# Patient Record
Sex: Female | Born: 1985 | Race: Black or African American | Hispanic: No | Marital: Single | Smoking: Never smoker
Health system: Southern US, Community
[De-identification: ages and names within clinical notes are randomized; demographics above are authoritative.]

## PROBLEM LIST (undated history)

## (undated) ENCOUNTER — Emergency Department: Discharge status: Absent without leave | Payer: Medicaid Other

## (undated) DIAGNOSIS — H16051 Mooren's corneal ulcer, right eye: Secondary | ICD-10-CM

## (undated) DIAGNOSIS — E669 Obesity, unspecified: Secondary | ICD-10-CM

## (undated) HISTORY — DX: Mooren's corneal ulcer, right eye: H16.051

## (undated) HISTORY — PX: EYE SURGERY: SHX253

---

## 2021-08-05 NOTE — Progress Notes (Addendum)
Client presents for Refugee Communicable Disease Screening.  The  consent for treatment was explained to the client.  The plan for the visit was explained to the client.  The communicable disease assessment was explained to the client.   Consent signed by client.     What is your Country of Origin? [place]  Been in [place] 21 years. Country of Nationality and Place of Birth: [place]   When was your date of arrival to [place]?    07/23/2021       Did you spend time anywhere else in the [place] prior to coming to [place]?  Just the airports   Name of [place]: [place]  Name of Case Worker: Kimberlee Nearing  Alien Number: [phone] Status: Refugee  Language Spoke: Swahili/French  Interpreter Badge Number [phone] and Name: Inda Merlin   Did you bring with you a copy of any Medical Records, Immunization Record.  (IOM) Yes   Do you have any Medical Conditions or Concerns today?   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  She also had surgery on her Left eye.   She has frequent bad headaches and she says she is forgetful.  She isn't sure if the headaches are from her vision issues, or if it is from something else.  She also said when she is angry her headaches are worse. Other Concern:  She has pain at her naval.  She has had this pain on and off ever since she had her C-Section.   She doesn't know why she has this pain. She also reported that she never used contraception but it took her many years to get pregnant with her first child, and wants to know to discuss reproductive health and/or contraception.   She also mentioned she was hospitalized a long time ago for Malaria.    Any Allergies to any Medications or Foods? No  Class A/B Condition:  None      Communicable Disease Screening Question: Do you have any rashes on your body?No      Are you currently experiencing any abdominal pain, vomiting,  or diarrhea?  No, except for the pain at my naval that I have had since the c-section.   Have you ever in your lifetime been sexually active?  Yes, doesn't not currently use contraceptives.        Women Only: When was the date of your last menstrual period?  08/08/2021      Do you currently have new or unusual vaginal discharge, burning, or itching?   No    The following laboratory screening tests offered and accepted by client today:  RPR, HIV,  Hep C,  , TB screening with QuantiFERON Gold, Hemoglobin Varicella Titer  Did the client decline any of the laboratory tests that were offered? No  Immunization records reviewed and Immunization Screening completed '@Flow'$ (Immunization screening) Varicella Screening:   Did the client report history of varicella disease? Not sure, doesn't remember    Titer offered and accepted Yes    Where any vaccines needed and/or administered?  Yes VIS provided, Hep A, Hep B, Flu and Tdap . Vaccines administered, copy of NCIR provided, Tolerated well, After vaccine care reviewed.  Reviewed dates for next vaccines.   General referral to primary care made?  Yes  Date of Apt:  [facility] will arrange apt. With [facility].  Seen at [facility] on  08/21/2021  Erie Radu Shelda Pal, RN

## 2021-08-08 ENCOUNTER — Ambulatory Visit (LOCAL_COMMUNITY_HEALTH_CENTER): Payer: Medicaid Other

## 2021-08-08 DIAGNOSIS — Z3200 Encounter for pregnancy test, result unknown: Secondary | ICD-10-CM

## 2021-08-08 DIAGNOSIS — Z3202 Encounter for pregnancy test, result negative: Secondary | ICD-10-CM | POA: Diagnosis not present

## 2021-08-08 DIAGNOSIS — Z719 Counseling, unspecified: Secondary | ICD-10-CM

## 2021-08-08 DIAGNOSIS — Z111 Encounter for screening for respiratory tuberculosis: Secondary | ICD-10-CM

## 2021-08-08 DIAGNOSIS — Z130 Encounter for screening for diseases of the blood and blood-forming organs and certain disorders involving the immune mechanism: Secondary | ICD-10-CM

## 2021-08-08 DIAGNOSIS — Z23 Encounter for immunization: Secondary | ICD-10-CM | POA: Diagnosis not present

## 2021-08-08 DIAGNOSIS — Z0184 Encounter for antibody response examination: Secondary | ICD-10-CM

## 2021-08-08 DIAGNOSIS — Z113 Encounter for screening for infections with a predominantly sexual mode of transmission: Secondary | ICD-10-CM

## 2021-08-08 LAB — PREGNANCY, URINE: Preg Test, Ur: NEGATIVE

## 2021-08-08 LAB — HM HEPATITIS C SCREENING LAB: HM Hepatitis Screen: NEGATIVE

## 2021-08-08 LAB — HM HIV SCREENING LAB: HM HIV Screening: NEGATIVE

## 2021-08-13 LAB — QUANTIFERON-TB GOLD PLUS
QuantiFERON Mitogen Value: >10 IU/mL
QuantiFERON Nil Value: 0.08 IU/mL
QuantiFERON TB1 Ag Value: 0.12 IU/mL
QuantiFERON TB2 Ag Value: 0.09 IU/mL
QuantiFERON-TB Gold Plus: NEGATIVE

## 2021-08-13 LAB — VARICELLA ZOSTER ANTIBODY, IGG: Varicella zoster IgG: 1089 index (ref >165)

## 2021-08-20 DIAGNOSIS — H16051 Mooren's corneal ulcer, right eye: Secondary | ICD-10-CM | POA: Insufficient documentation

## 2021-08-30 ENCOUNTER — Encounter: Payer: Self-pay | Admitting: Obstetrics and Gynecology

## 2021-09-21 ENCOUNTER — Emergency Department: Payer: Medicaid Other

## 2021-09-21 ENCOUNTER — Emergency Department
Admission: EM | Admit: 2021-09-21 | Discharge: 2021-09-21 | Disposition: A | Discharge status: Home / Self Care | Payer: Medicaid Other | Attending: Emergency Medicine | Admitting: Emergency Medicine

## 2021-09-21 ENCOUNTER — Encounter: Payer: Self-pay | Admitting: Emergency Medicine

## 2021-09-21 ENCOUNTER — Other Ambulatory Visit: Payer: Self-pay

## 2021-09-21 DIAGNOSIS — R1030 Lower abdominal pain, unspecified: Secondary | ICD-10-CM | POA: Diagnosis not present

## 2021-09-21 DIAGNOSIS — R109 Unspecified abdominal pain: Secondary | ICD-10-CM | POA: Diagnosis present

## 2021-09-21 LAB — LIPASE, BLOOD: Lipase: 41 U/L (ref 11–51)

## 2021-09-21 LAB — CBC
HCT: 40.4 % (ref 36.0–46.0)
Hemoglobin: 13 g/dL (ref 12.0–15.0)
MCH: 27.4 pg (ref 26.0–34.0)
MCHC: 32.2 g/dL (ref 30.0–36.0)
MCV: 85.1 fL (ref 80.0–100.0)
Platelets: 251 10*3/uL (ref 150–400)
RBC: 4.75 MIL/uL (ref 3.87–5.11)
RDW: 14.3 % (ref 11.5–15.5)
WBC: 5.1 10*3/uL (ref 4.0–10.5)
nRBC: 0 % (ref 0.0–0.2)

## 2021-09-21 LAB — URINALYSIS, ROUTINE W REFLEX MICROSCOPIC
Bilirubin Urine: NEGATIVE
Glucose, UA: NEGATIVE mg/dL
Hgb urine dipstick: NEGATIVE
Ketones, ur: NEGATIVE mg/dL
Leukocytes,Ua: NEGATIVE
Nitrite: NEGATIVE
Protein, ur: NEGATIVE mg/dL
Specific Gravity, Urine: 1.02 (ref 1.005–1.030)
pH: 5 (ref 5.0–8.0)

## 2021-09-21 LAB — COMPREHENSIVE METABOLIC PANEL
ALT: 15 U/L (ref 0–44)
AST: 18 U/L (ref 15–41)
Albumin: 4 g/dL (ref 3.5–5.0)
Alkaline Phosphatase: 76 U/L (ref 38–126)
Anion gap: 7 (ref 5–15)
BUN: 13 mg/dL (ref 6–20)
CO2: 23 mmol/L (ref 22–32)
Calcium: 8.9 mg/dL (ref 8.9–10.3)
Chloride: 105 mmol/L (ref 98–111)
Creatinine, Ser: 0.6 mg/dL (ref 0.44–1.00)
GFR, Estimated: >60 mL/min (ref >60)
Glucose, Bld: 112 mg/dL — ABNORMAL HIGH (ref 70–99)
Potassium: 3.8 mmol/L (ref 3.5–5.1)
Sodium: 135 mmol/L (ref 135–145)
Total Bilirubin: 0.6 mg/dL (ref 0.3–1.2)
Total Protein: 7.8 g/dL (ref 6.5–8.1)

## 2021-09-21 LAB — POC URINE PREG, ED: Preg Test, Ur: NEGATIVE

## 2021-09-21 MED ORDER — HYOSCYAMINE SULFATE SL 0.125 MG SL SUBL: 0.1250 mg | SUBLINGUAL_TABLET | Freq: Four times a day (QID) | SUBLINGUAL | 0 refills | Status: DC | PRN

## 2021-09-21 MED ORDER — OXYCODONE HCL 5 MG PO TABS
5.0000 mg | ORAL_TABLET | Freq: Once | ORAL | Status: AC
  Administered 2021-09-21: 5 mg via ORAL
  Filled 2021-09-21: qty 1

## 2021-09-21 NOTE — ED Notes (Signed)
Interpreter assisting this RN, provider CBT and registration to communicate with pt and family. Monitor attached to pt. Pt has severe back and abdominal pain that started a few days ago but got much worse today causing her to come to ER. Pt denies nausea and fever; reports chills. Pt tender at L and medial lower abdomen. Pt tender at R lower back. Pt grimaces when changing position. Pt denies chronic conditions besides her "eyes"; pt denies abdominal surgery history other than c-section. Pt denies having taken any medications today. Family remains with pt.

## 2021-09-21 NOTE — ED Triage Notes (Signed)
Pt via POV from home. Pt c/o lower back pain, lower abd pain, and nausea for the past week. Denies any urinary symptoms. Denies any vomiting. Denies any abd surgeries other than a c-section. Pt is A&Ox4 and NAD. Swahili interpreter used.

## 2021-09-21 NOTE — ED Notes (Signed)
Called [place] at this time to set up taxi transportation.

## 2021-09-21 NOTE — ED Notes (Signed)
Interpreter requested 

## 2021-09-21 NOTE — ED Notes (Signed)
Patient transported to CT 

## 2021-09-21 NOTE — ED Provider Notes (Signed)
[facility] Provider Note    Event Date/Time   First MD Initiated Contact with Patient 09/21/21 1147     (approximate)   History   Abdominal Pain   HPI  Lori Richards is a 36 y.o. female with no significant past medica and as listed in EMR presents to the emergency department for treatment and evaluation of abdominal pain for the past week. Symptoms worsened earlier today. She denies urinary symptoms, fever, nausea. Only surgical history is c-section.       Physical Exam   Triage Vital Signs: ED Triage Vitals  Enc Vitals Group     BP 09/21/21 1011 128/87     Pulse Rate 09/21/21 1011 78     Resp 09/21/21 1011 18     Temp 09/21/21 1011 97.7 F (36.5 C)     Temp Source 09/21/21 1011 Oral     SpO2 09/21/21 1011 100 %     Weight 09/21/21 1007 167 lb 8.8 oz (76 kg)     Height 09/21/21 1007 4' 11.06" (1.5 m)     Head Circumference --      Peak Flow --      Pain Score 09/21/21 1007 7     Pain Loc --      Pain Edu? --      Excl. in [place]? --     Most recent vital signs: Vitals:   09/21/21 1430 09/21/21 1453  BP:  118/79  Pulse: 70 79  Resp:  16  Temp:  97.8 F (36.6 C)  SpO2:  100%    General: Awake, no distress.  CV:  Good peripheral perfusion.  Resp:  Normal effort.  Abd:  No distention. No focal tenderness. No rebound tenderness. Other:  No rash on exposed skin   ED Results / Procedures / Treatments   Labs (all labs ordered are listed, but only abnormal results are displayed) Labs Reviewed  COMPREHENSIVE METABOLIC PANEL - Abnormal; Notable for the following components:      Result Value   Glucose, Bld 112 (*)    All other components within normal limits  LIPASE, BLOOD  CBC  URINALYSIS, ROUTINE W REFLEX MICROSCOPIC  POC URINE PREG, ED     EKG  Not indicated.   RADIOLOGY  Image independently interpreted and radiology report reviewed by me.  CT of the abdomen and pelvis was negative for acute  concerns.  PROCEDURES:  Critical Care performed: No  Procedures   MEDICATIONS ORDERED IN ED: Medications  oxyCODONE (Oxy IR/ROXICODONE) immediate release tablet 5 mg (5 mg Oral Given 09/21/21 1213)     IMPRESSION / MDM / ASSESSMENT AND PLAN / ED COURSE   I have reviewed the triage note.  Differential diagnosis includes, but is not limited to: Acute cystitis, cholecystitis, gastroenteritis, acute viral syndrome, ovarian cysts, ileus, small bowel obstruction,  Labs are all reassuring.  No leukocytosis, no electrolyte abnormalities, lipase is normal, urinalysis is normal, and pregnancy test is negative.  CT of the abdomen and pelvis with contrast is negative for any acute concerns.   Patient will be discharged home.  She will be given a prescription for Levsin which should help with the nausea and abdominal pain.  She is to follow-up with primary care if symptoms are not improving over the next couple of days.  If symptoms change or worsen, she was encouraged to return to the emergency department.  Swahili interpreter utilized for assessment, exam, and discharge instructions.  FINAL CLINICAL IMPRESSION(S) / ED DIAGNOSES   Final diagnoses:  Lower abdominal pain     Rx / DC Orders   ED Discharge Orders          Ordered    Hyoscyamine Sulfate SL (LEVSIN/SL) 0.125 MG SUBL  Every 6 hours PRN        09/21/21 1424             Note:  This document was prepared using Dragon voice recognition software and may include unintentional dictation errors.   Victorino Dike, FNP 09/21/21 1759    Harvest Dark, MD 09/21/21 1901

## 2021-09-21 NOTE — ED Notes (Signed)
Completed DC instructions with assistance from telephone interpreter (Swahili). Extensive education provided. Pt explains that she has only been in the [place] for 2 months and just started her first job here last week, so she has no [place] currency and no way to get home. RN contacted charge nurse to request input on assisting pt to get home.   Pt address is [address] [place], [place] [address]

## 2021-10-15 ENCOUNTER — Ambulatory Visit (LOCAL_COMMUNITY_HEALTH_CENTER): Payer: Medicaid Other

## 2021-10-15 DIAGNOSIS — Z23 Encounter for immunization: Secondary | ICD-10-CM | POA: Diagnosis not present

## 2021-10-15 DIAGNOSIS — Z719 Counseling, unspecified: Secondary | ICD-10-CM

## 2021-10-15 NOTE — Progress Notes (Signed)
Pt agreed for HPV and Hep B vaccines today. Administered 2 vaccines, tolerated well. Swahili interpretation provided by Louretta Parma [phone] Language line solution. Given VIS and updated NCIR. Clent Ridges, LPN.

## 2021-11-07 ENCOUNTER — Encounter: Payer: Self-pay | Admitting: Obstetrics and Gynecology

## 2021-11-07 ENCOUNTER — Ambulatory Visit: Payer: Medicaid Other

## 2021-11-12 ENCOUNTER — Ambulatory Visit: Payer: Medicaid Other

## 2021-11-26 ENCOUNTER — Ambulatory Visit (LOCAL_COMMUNITY_HEALTH_CENTER): Payer: Medicaid Other

## 2021-11-26 DIAGNOSIS — Z23 Encounter for immunization: Secondary | ICD-10-CM

## 2021-11-26 DIAGNOSIS — Z719 Counseling, unspecified: Secondary | ICD-10-CM

## 2021-11-26 NOTE — Progress Notes (Addendum)
Patient seen in nurse clinic for vaccination.  Language line used - Jeannie Done [phone].  HPV 2nd dose administered in right deltoid. Tolerated well.  VIS provided. NCIR updated and copy provided to patient.

## 2021-11-27 ENCOUNTER — Ambulatory Visit: Payer: Medicaid Other

## 2021-12-10 ENCOUNTER — Telehealth: Payer: Self-pay

## 2022-01-03 ENCOUNTER — Telehealth: Payer: Self-pay

## 2022-01-03 NOTE — Telephone Encounter (Signed)
Client reached out to [facility] because she is starting a new job, and thinks she might be pregnant.  She has an appointment with a doctor someplace but wasn't sure of the name or how to reach them.  Language line used to contact the client, [place] number [phone] [facility].      I was able to see her appointment with [facility] on 9/12.  With the assistance of the interpreter we were able to contact [facility] and have the appointment changed to Tuesday 01/28/2022.  Patient reports that she is vomiting a lot.  Advised to seek care at the emergency dept for worsening symptoms and/or unable to hold down foods or liquids    Patient also advised she could come to the [facility] for a pregnancy test if needed prior to her appointment on 10/3.     She will set up an appointment for the pregnancy test  once she has her new work schedule.     Keyra Virella Shelda Pal, RN

## 2022-01-07 ENCOUNTER — Encounter: Payer: Self-pay | Admitting: Obstetrics and Gynecology

## 2022-01-07 DIAGNOSIS — Z7689 Persons encountering health services in other specified circumstances: Secondary | ICD-10-CM

## 2022-01-09 ENCOUNTER — Ambulatory Visit (LOCAL_COMMUNITY_HEALTH_CENTER): Payer: Medicaid Other

## 2022-01-09 VITALS — BP 119/71 | Wt 167.0 lb

## 2022-01-09 DIAGNOSIS — Z3201 Encounter for pregnancy test, result positive: Secondary | ICD-10-CM | POA: Diagnosis not present

## 2022-01-09 LAB — PREGNANCY, URINE: Preg Test, Ur: POSITIVE — AB

## 2022-01-09 MED ORDER — PRENATAL VITAMINS 28-0.8 MG PO TABS: 28.0000 mg | ORAL_TABLET | Freq: Every day | ORAL | 0 refills | Status: DC

## 2022-01-09 NOTE — Progress Notes (Signed)
UPT positive.  Interpreter Geni Bers id # [id number]. See pregnancy flow sheet.  Desires care at [facility].  Complains of n/v almost daily.  Advised small frequent meals, avoid spicy foods, etc. Explained to her that we cannot give her anything for n/v until established in our prenatal clinic.  Discussed s/s ectopic pregnancy and seek medical attention if occurs. Pt agreed. The patient was dispensed prenatal vitamins  today. I provided counseling today regarding the medication. We discussed the medication, the side effects and when to call clinic. Patient given the opportunity to ask questions. Questions answered.     Walked pt to clerk for presumptive eligibility.    Tonny Branch, RN

## 2022-01-20 ENCOUNTER — Emergency Department
Admission: EM | Admit: 2022-01-20 | Discharge: 2022-01-20 | Disposition: A | Discharge status: Home / Self Care | Payer: Medicaid Other | Attending: Emergency Medicine | Admitting: Emergency Medicine

## 2022-01-20 ENCOUNTER — Other Ambulatory Visit: Payer: Self-pay

## 2022-01-20 ENCOUNTER — Emergency Department: Payer: Medicaid Other

## 2022-01-20 DIAGNOSIS — R1032 Left lower quadrant pain: Secondary | ICD-10-CM | POA: Diagnosis not present

## 2022-01-20 DIAGNOSIS — Z3A01 Less than 8 weeks gestation of pregnancy: Secondary | ICD-10-CM | POA: Insufficient documentation

## 2022-01-20 DIAGNOSIS — O26891 Other specified pregnancy related conditions, first trimester: Secondary | ICD-10-CM

## 2022-01-20 LAB — COMPREHENSIVE METABOLIC PANEL
ALT: 13 U/L (ref 0–44)
AST: 15 U/L (ref 15–41)
Albumin: 3.9 g/dL (ref 3.5–5.0)
Alkaline Phosphatase: 61 U/L (ref 38–126)
Anion gap: 7 (ref 5–15)
BUN: 7 mg/dL (ref 6–20)
CO2: 23 mmol/L (ref 22–32)
Calcium: 8.8 mg/dL — ABNORMAL LOW (ref 8.9–10.3)
Chloride: 107 mmol/L (ref 98–111)
Creatinine, Ser: 0.63 mg/dL (ref 0.44–1.00)
GFR, Estimated: >60 mL/min (ref >60)
Glucose, Bld: 106 mg/dL — ABNORMAL HIGH (ref 70–99)
Potassium: 3.5 mmol/L (ref 3.5–5.1)
Sodium: 137 mmol/L (ref 135–145)
Total Bilirubin: 0.6 mg/dL (ref 0.3–1.2)
Total Protein: 7.3 g/dL (ref 6.5–8.1)

## 2022-01-20 LAB — CBC WITH DIFFERENTIAL/PLATELET
Abs Immature Granulocytes: 0.01 10*3/uL (ref 0.00–0.07)
Basophils Absolute: 0.1 10*3/uL (ref 0.0–0.1)
Basophils Relative: 1 %
Eosinophils Absolute: 0.1 10*3/uL (ref 0.0–0.5)
Eosinophils Relative: 3 %
HCT: 34.4 % — ABNORMAL LOW (ref 36.0–46.0)
Hemoglobin: 11.1 g/dL — ABNORMAL LOW (ref 12.0–15.0)
Immature Granulocytes: 0 %
Lymphocytes Relative: 40 %
Lymphs Abs: 2 10*3/uL (ref 0.7–4.0)
MCH: 26.3 pg (ref 26.0–34.0)
MCHC: 32.3 g/dL (ref 30.0–36.0)
MCV: 81.5 fL (ref 80.0–100.0)
Monocytes Absolute: 0.4 10*3/uL (ref 0.1–1.0)
Monocytes Relative: 7 %
Neutro Abs: 2.4 10*3/uL (ref 1.7–7.7)
Neutrophils Relative %: 49 %
Platelets: 260 10*3/uL (ref 150–400)
RBC: 4.22 MIL/uL (ref 3.87–5.11)
RDW: 14.8 % (ref 11.5–15.5)
WBC: 4.9 10*3/uL (ref 4.0–10.5)
nRBC: 0 % (ref 0.0–0.2)

## 2022-01-20 LAB — URINALYSIS, ROUTINE W REFLEX MICROSCOPIC
Bilirubin Urine: NEGATIVE
Glucose, UA: NEGATIVE mg/dL
Hgb urine dipstick: NEGATIVE
Ketones, ur: 5 mg/dL — AB
Leukocytes,Ua: NEGATIVE
Nitrite: NEGATIVE
Protein, ur: NEGATIVE mg/dL
Specific Gravity, Urine: 1.003 — ABNORMAL LOW (ref 1.005–1.030)
pH: 7 (ref 5.0–8.0)

## 2022-01-20 LAB — HCG, QUANTITATIVE, PREGNANCY: hCG, Beta Chain, Quant, S: 27477 m[IU]/mL — ABNORMAL HIGH (ref <5)

## 2022-01-20 MED ORDER — ACETAMINOPHEN 500 MG PO TABS
1000.0000 mg | ORAL_TABLET | Freq: Once | ORAL | Status: AC
Start: 2022-01-20 — End: 2022-01-20
  Administered 2022-01-20: 1000 mg via ORAL
  Filled 2022-01-20: qty 2

## 2022-01-20 NOTE — Discharge Instructions (Signed)
You may take Tylenol (acetaminophen) as needed for pain as this medication is perfectly safe in pregnancy.

## 2022-01-20 NOTE — ED Provider Notes (Signed)
[facility] Provider Note    Event Date/Time   First MD Initiated Contact with Patient 01/20/22 1510     (approximate)   History   Abdominal Pain   HPI  Lori Richards is a 36 y.o. female G4, P2 who presents with lower abdominal pain.  Patient's LMP was 8/17.  She is following at the health department for her pregnancy.  Over the last 3 days she has had intermittent left lower quadrant abdominal pain.  Today while she was at work she had a gush of what she says was clearish appearing fluid.  Denies any vaginal bleeding.  Denies pain with urination.  Denies any nausea or vomiting.  No diarrhea or constipation.  Endorses feeling very fatigued today no fevers or chills.     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 --      Excl. in [place]? --     Most recent vital signs: Vitals:   01/20/22 1155  BP: 127/85  Pulse: 72  Resp: 17  Temp: 97.8 F (36.6 C)  SpO2: 95%     General: Awake, no distress.  CV:  Good peripheral perfusion.  Resp:  Normal effort.  Abd:  No distention.  Neuro:             Awake, Alert, Oriented x 3  Other:  Mild tenderness to palpation left lower quadrant suprapubic region, no guarding, abdomen soft, right lower quadrant is nontender   ED Results / Procedures / Treatments  Labs (all labs ordered  are listed, but only abnormal results are displayed) Labs Reviewed  CBC WITH DIFFERENTIAL/PLATELET - Abnormal; Notable for the following components:      Result Value   Hemoglobin 11.1 (*)    HCT 34.4 (*)    All other components within normal limits  COMPREHENSIVE METABOLIC PANEL - Abnormal; Notable for the following components:   Glucose, Bld 106 (*)    Calcium 8.8 (*)    All other components within normal limits  HCG, QUANTITATIVE, PREGNANCY - Abnormal; Notable for the following components:   hCG, Beta Chain, Quant, S 27,477 (*)    All other components within normal limits  URINALYSIS, ROUTINE W REFLEX MICROSCOPIC - Abnormal; Notable for the following components:   Color, Urine STRAW (*)    APPearance CLEAR (*)    Specific Gravity, Urine 1.003 (*)    Ketones, ur 5 (*)    All other components within normal limits  POC URINE PREG, ED     EKG  RADIOLOGY Pelvic ultrasound is pending at the time of signout   PROCEDURES:  Critical Care performed: No  Procedures  The patient is on the cardiac monitor to evaluate for evidence of arrhythmia and/or significant heart rate changes.   MEDICATIONS ORDERED IN ED: Medications  acetaminophen (TYLENOL) tablet 1,000 mg (1,000 mg Oral Given 01/20/22 1416)     IMPRESSION / MDM / ASSESSMENT AND PLAN / ED COURSE  I reviewed the triage vital signs and the nursing notes.                              Patient's presentation is most consistent with acute presentation with potential threat to life or bodily function.  Differential diagnosis includes, but is not limited to, topic pregnancy, round ligament pain, constipation, diverticulitis, pyelonephritis, kidney stone  Patient is a 36 year old female G4, P2 who presents with left lower quadrant abdominal pain for the last 3 days.  She has not had an ultrasound yet this pregnancy.  Did have some clear fluid leakage today but denies vaginal bleeding.  Denies urinary symptoms.  Her vital  signs are reassuring.  Overall she looks well on exam.  She does have tenderness primarily in the left lower quadrant suprapubic region.  UA is negative CBC and CMP are reassuring.  Will obtain pelvic ultrasound to evaluate for IUP rule out ectopic or other ovarian pathology that could be causing her pain.  We will give Tylenol.  If this is negative feeling improved I think she will be appropriate for discharge with outpatient follow-up.  Patient signed out to oncoming rider pending imaging.   Clinical Course as of 01/20/22 1514  Mon Jan 20, 2022  1340 HCG, Bosie Helper(!): 01,751 [CC]    Clinical Course User Index [CC] Leisa Lenz, [place], [place]     FINAL CLINICAL IMPRESSION(S) / ED DIAGNOSES   Final diagnoses:  LLQ pain     Rx / DC Orders   ED Discharge Orders     None        Note:  This document was prepared using Dragon voice recognition software and may include unintentional dictation errors.   Rada Hay, MD 01/20/22 [phone]

## 2022-01-20 NOTE — ED Notes (Signed)
Pt verbalized understanding of discharge instructions and follow-up care instructions.   Swahili Interpreter used in discharge teaching.   Interpreter nameMayra Neer   ID #: [id number]

## 2022-01-20 NOTE — ED Triage Notes (Signed)
First Nurse Note:  Pt via EMS from home. Pt is [redacted] weeks pregnant. Pt c/o abd pain. No vaginal bleeding. Pt is A&Ox4 and NAD  VSS WNL 131 CBG  98.6 oral   Needs Swahili interpreter

## 2022-01-20 NOTE — ED Triage Notes (Signed)
Pt presents to ED with /co of ABD pain and states she is [redacted] weeks pregnant. Pt denies vaginal bleeding but does endorse some discharge. Pt states 1 miscarriage in 2016.    Swahili Interpreter used in triage Name: Donella Stade  # [phone]

## 2022-01-20 NOTE — ED Provider Notes (Signed)
-----------------------------------------   3:10 PM on 01/20/2022 -----------------------------------------  Blood pressure 127/85, pulse 72, temperature 97.8 F (36.6 C), temperature source Oral, resp. rate 17, height '4\' 11"'$  (1.499 m), weight 75.7 kg, last menstrual period 12/12/2021, SpO2 95 %.  Assuming care from Dr. Starleen Blue.  In short, Lori Richards is a 36 y.o. female with a chief complaint of Abdominal Pain .  Refer to the original H&P for additional details.  The current plan of care is to follow-up Obstetric ultrasound for abdominal pain in pregnancy.  ----------------------------------------- 6:23 PM on 01/20/2022 ----------------------------------------- Obstetrical ultrasound shows intrauterine gestational and yolk sacs, no findings concerning for ectopic pregnancy.  On reassessment, patient reports improved pain following dose of Tylenol.  She is appropriate for discharge home with OB/GYN follow-up at the health department, was counseled to return to the ED for new or worsening symptoms.  Patient agrees with plan.    Blake Divine, MD 01/20/22 [phone]

## 2022-01-28 ENCOUNTER — Encounter: Payer: Self-pay | Admitting: Obstetrics and Gynecology

## 2022-02-03 ENCOUNTER — Encounter: Payer: Self-pay | Admitting: Obstetrics and Gynecology

## 2022-02-06 ENCOUNTER — Emergency Department: Payer: Medicaid Other

## 2022-02-06 ENCOUNTER — Emergency Department
Admission: EM | Admit: 2022-02-06 | Discharge: 2022-02-06 | Disposition: A | Discharge status: Home / Self Care | Payer: Medicaid Other | Attending: Emergency Medicine | Admitting: Emergency Medicine

## 2022-02-06 ENCOUNTER — Other Ambulatory Visit: Payer: Self-pay

## 2022-02-06 DIAGNOSIS — O26891 Other specified pregnancy related conditions, first trimester: Secondary | ICD-10-CM | POA: Insufficient documentation

## 2022-02-06 DIAGNOSIS — W19XXXA Unspecified fall, initial encounter: Secondary | ICD-10-CM

## 2022-02-06 DIAGNOSIS — N9489 Other specified conditions associated with female genital organs and menstrual cycle: Secondary | ICD-10-CM | POA: Diagnosis not present

## 2022-02-06 DIAGNOSIS — Z3A08 8 weeks gestation of pregnancy: Secondary | ICD-10-CM | POA: Insufficient documentation

## 2022-02-06 DIAGNOSIS — R1032 Left lower quadrant pain: Secondary | ICD-10-CM | POA: Diagnosis not present

## 2022-02-06 LAB — HEPATIC FUNCTION PANEL
ALT: 16 U/L (ref 0–44)
AST: 18 U/L (ref 15–41)
Albumin: 3.8 g/dL (ref 3.5–5.0)
Alkaline Phosphatase: 51 U/L (ref 38–126)
Bilirubin, Direct: <0.1 mg/dL (ref 0.0–0.2)
Total Bilirubin: 0.6 mg/dL (ref 0.3–1.2)
Total Protein: 7.8 g/dL (ref 6.5–8.1)

## 2022-02-06 LAB — URINALYSIS, ROUTINE W REFLEX MICROSCOPIC
Bilirubin Urine: NEGATIVE
Glucose, UA: NEGATIVE mg/dL
Hgb urine dipstick: NEGATIVE
Ketones, ur: NEGATIVE mg/dL
Leukocytes,Ua: NEGATIVE
Nitrite: NEGATIVE
Protein, ur: NEGATIVE mg/dL
Specific Gravity, Urine: 1.005 (ref 1.005–1.030)
pH: 7 (ref 5.0–8.0)

## 2022-02-06 LAB — BASIC METABOLIC PANEL
Anion gap: 7 (ref 5–15)
BUN: 8 mg/dL (ref 6–20)
CO2: 21 mmol/L — ABNORMAL LOW (ref 22–32)
Calcium: 9.3 mg/dL (ref 8.9–10.3)
Chloride: 107 mmol/L (ref 98–111)
Creatinine, Ser: 0.62 mg/dL (ref 0.44–1.00)
GFR, Estimated: >60 mL/min (ref >60)
Glucose, Bld: 100 mg/dL — ABNORMAL HIGH (ref 70–99)
Potassium: 4.2 mmol/L (ref 3.5–5.1)
Sodium: 135 mmol/L (ref 135–145)

## 2022-02-06 LAB — PREGNANCY, URINE: Preg Test, Ur: POSITIVE — AB

## 2022-02-06 LAB — CBC
HCT: 36.4 % (ref 36.0–46.0)
Hemoglobin: 11.6 g/dL — ABNORMAL LOW (ref 12.0–15.0)
MCH: 26.4 pg (ref 26.0–34.0)
MCHC: 31.9 g/dL (ref 30.0–36.0)
MCV: 82.9 fL (ref 80.0–100.0)
Platelets: 246 10*3/uL (ref 150–400)
RBC: 4.39 MIL/uL (ref 3.87–5.11)
RDW: 16 % — ABNORMAL HIGH (ref 11.5–15.5)
WBC: 5 10*3/uL (ref 4.0–10.5)
nRBC: 0 % (ref 0.0–0.2)

## 2022-02-06 LAB — LIPASE, BLOOD: Lipase: 35 U/L (ref 11–51)

## 2022-02-06 LAB — HCG, QUANTITATIVE, PREGNANCY: hCG, Beta Chain, Quant, S: 186109 m[IU]/mL — ABNORMAL HIGH (ref <5)

## 2022-02-06 MED ORDER — ACETAMINOPHEN 325 MG PO TABS
650.0000 mg | ORAL_TABLET | Freq: Once | ORAL | Status: AC
  Administered 2022-02-06: 650 mg via ORAL
  Filled 2022-02-06: qty 2

## 2022-02-06 NOTE — ED Triage Notes (Signed)
Pt was working pulling boxes and dropped the boxes and fell on top of the boxes, and felt LLQ pain and left leg pain, pt states lower back also hurts. Pt states she is 7-[redacted] weeks pregnant.  Swahili interpreter used in triage.

## 2022-02-06 NOTE — ED Provider Triage Note (Signed)
Emergency Medicine Provider Triage Evaluation Note  Lori Richards , a 36 y.o. female  was evaluated in triage.  Pt complains of abdominal pain.  Patient was at work and injured her abdomen and lower back.  Patient is [redacted] weeks pregnant..  Review of Systems  Positive: Abdominal pain Negative: Vaginal bleeding  Physical Exam  BP (!) 125/97 (BP Location: Left Arm)   Pulse 68   Temp 98.7 F (37.1 C) (Oral)   Resp 20   Ht 5' 0.63" (1.54 m)   Wt 75 kg   LMP 12/12/2021 (Exact Date)   SpO2 100%   BMI 31.62 kg/m  Gen:   Awake, no distress   Resp:  Normal effort  MSK:   Moves extremities without difficulty  Other:    Medical Decision Making  Medically screening exam initiated at 9:32 AM.  Appropriate orders placed.  Lori Richards was informed that the remainder of the evaluation will be completed by another provider, this initial triage assessment does not replace that evaluation, and the importance of remaining in the ED until their evaluation is complete.  Patient was given Tylenol for pain in the triage area.  Ordered labs and ultrasound   Versie Starks, PA-C 02/06/22 [phone]

## 2022-02-06 NOTE — ED Notes (Signed)
Patient discharged to home per MD order. Patient in stable condition, and deemed medically cleared by ED provider for discharge. Discharge instructions reviewed with patient/family using "Teach Back"; verbalized understanding of medication education and administration, and information about follow-up care. Denies further concerns. ° °

## 2022-02-06 NOTE — ED Provider Notes (Signed)
[facility] Provider Note    Event Date/Time   First MD Initiated Contact with Patient 02/06/22 1048     (approximate)   History   Abdominal Pain   HPI  Lori Richards is a 36 y.o. female  who comes in with abdominal pain.  LMP is 8/17.  Came in in September for LLQ pain and had US showing yolk sac and mean sac 5 weeks 5 days. Pt reports mechanical fall where she fell on top of some boxes this happened today. She tripped and fell onto the boxes..  No vaginal bleeding or gush of fluids. No pain prior to the fall  all started after the fall.. Did not hit her head she more caught herself with her hands and the belly hit the boxes. No vaginal discharge. No new sexual partners    Physical Exam   Triage Vital Signs: ED Triage Vitals  Enc Vitals Group     BP 02/06/22 0921 (!) 125/97     Pulse Rate 02/06/22 0921 68     Resp 02/06/22 0921 20     Temp 02/06/22 0921 98.7 F (37.1 C)     Temp Source 02/06/22 0921 Oral     SpO2 02/06/22 0921 100 %     Weight 02/06/22 0922 165 lb 5.5 oz (75 kg)     Height 02/06/22 0922 5' 0.63" (1.54 m)     Head Circumference --      Peak Flow --      Pain Score 02/06/22 0921 6     Pain Loc --      Pain Edu? --      Excl. in [place]? --     Most recent vital signs: Vitals:   02/06/22 0921  BP: (!) 125/97  Pulse: 68  Resp: 20  Temp: 98.7 F (37.1 C)  SpO2: 100%     General: Awake, no distress.  CV:  Good peripheral perfusion.  Resp:  Normal effort.  Abd:  No distention.  Other:  No chest wall tenderness, no head tenderness, no cervical spine tenderness  mild llq pain.    ED Results / Procedures / Treatments   Labs (all labs ordered are listed, but only abnormal results are displayed) Labs Reviewed  CBC - Abnormal; Notable for the following components:      Result Value   Hemoglobin 11.6 (*)    RDW 16.0 (*)    All other components within normal limits  BASIC METABOLIC PANEL - Abnormal; Notable for the  following components:   CO2 21 (*)    Glucose, Bld 100 (*)    All other components within normal limits  PREGNANCY, URINE - Abnormal; Notable for the following components:   Preg Test, Ur POSITIVE (*)    All other components within normal limits  HCG, QUANTITATIVE, PREGNANCY       RADIOLOGY I have reviewed the  [place] personally evaluated and interpretted  + fetus    PROCEDURES:  Critical Care performed: No  Procedures   MEDICATIONS ORDERED IN ED: Medications  acetaminophen (TYLENOL) tablet 650 mg (650 mg Oral Given 02/06/22 0935)     IMPRESSION / MDM / ASSESSMENT AND PLAN / ED COURSE  I reviewed the triage vital signs and the nursing notes.   Patient's presentation is most consistent with acute presentation with potential threat to life or bodily function.   Differential ectopic, miscarriage, no concerns for stds, msk.  Low mechanism and no rebound or guarding to  suggest injury from the fall. No bruising noted- Pt concern about fetus.   Cbc stable hemoglobin Ua negative LFT negative bmp reassuring HCG elevated  NO vaginal bleeding to need RH status   IMPRESSION: Single intrauterine gestation at sonographic gestational age of [redacted] weeks, 5 days. EDD 09/13/2022. Fetal heart rate 167 beats per minute.    Pt re-assured with results will return for vaginal bleeding or other concerns.     FINAL CLINICAL IMPRESSION(S) / ED DIAGNOSES   Final diagnoses:  Fall, initial encounter  [redacted] weeks gestation of pregnancy     Rx / DC Orders   ED Discharge Orders     None        Note:  This document was prepared using Dragon voice recognition software and may include unintentional dictation errors.   Vanessa Stidham, MD 02/06/22 1240

## 2022-02-06 NOTE — Discharge Instructions (Addendum)
You can take up to 1g of tylenol every 8 hours. DO NOT TAKE IBUPROFEN OR MOTRIN Follow up with OB.  Return for vaginal bleeding, worsening pain or any other cocnerns   IMPRESSION:  Single intrauterine gestation at sonographic gestational age of [redacted]  weeks, 5 days. EDD 09/13/2022. Fetal heart rate 167 beats per  minute.

## 2022-02-07 LAB — URINE CULTURE: Culture: NO GROWTH

## 2022-02-17 ENCOUNTER — Telehealth: Payer: Self-pay

## 2022-02-17 NOTE — Telephone Encounter (Signed)
[place] Language line used, Interpreter agent name and badge number:  Fritz Pickerel #[id number]. Patient has been set up with transportation. To eye doc appt. With [employer] transportation. Trip reference # [id number]. Patient will be picked up between 1205-1230. And will be picked up at 330pm. Text message has been left. Unable to leave voicemail, mailbox full.

## 2022-02-24 ENCOUNTER — Telehealth: Payer: Self-pay

## 2022-02-24 NOTE — Telephone Encounter (Addendum)
[place] [employer] Language line used, Paramedic name and badge number:  Mateo Flow [id number]. Patient VM full. Unable to leave voicemail. Patient was sent a message reminder of transportation update.Patient will be picked up around 12:05, to get at [facility] at by 12:45pm for check in.   Flu shot appt set up for 03/06/2022 at 9am.

## 2022-02-27 ENCOUNTER — Encounter: Payer: Self-pay | Admitting: Physician Assistant

## 2022-02-27 ENCOUNTER — Ambulatory Visit: Payer: Medicaid Other | Admitting: Physician Assistant

## 2022-02-27 VITALS — BP 113/76 | HR 77 | Temp 98.0°F | Wt 162.6 lb

## 2022-02-27 DIAGNOSIS — O09521 Supervision of elderly multigravida, first trimester: Secondary | ICD-10-CM | POA: Diagnosis not present

## 2022-02-27 DIAGNOSIS — O99210 Obesity complicating pregnancy, unspecified trimester: Secondary | ICD-10-CM | POA: Insufficient documentation

## 2022-02-27 DIAGNOSIS — Z98891 History of uterine scar from previous surgery: Secondary | ICD-10-CM

## 2022-02-27 DIAGNOSIS — O09899 Supervision of other high risk pregnancies, unspecified trimester: Secondary | ICD-10-CM | POA: Insufficient documentation

## 2022-02-27 DIAGNOSIS — O219 Vomiting of pregnancy, unspecified: Secondary | ICD-10-CM

## 2022-02-27 DIAGNOSIS — O09529 Supervision of elderly multigravida, unspecified trimester: Secondary | ICD-10-CM | POA: Insufficient documentation

## 2022-02-27 DIAGNOSIS — O99211 Obesity complicating pregnancy, first trimester: Secondary | ICD-10-CM

## 2022-02-27 DIAGNOSIS — Z3400 Encounter for supervision of normal first pregnancy, unspecified trimester: Secondary | ICD-10-CM

## 2022-02-27 LAB — HEMOGLOBIN, FINGERSTICK: Hemoglobin: 12.2 g/dL (ref 11.1–15.9)

## 2022-02-27 MED ORDER — ASPIRIN 81 MG PO TBEC: 81.0000 mg | DELAYED_RELEASE_TABLET | Freq: Every day | ORAL | Status: DC

## 2022-02-27 NOTE — Progress Notes (Signed)
Patient hgb reviewed, no treatment indicated. Patient felt faint in lab after blood draw, felt weak and dizzy, BP at 4:02pm was 135/85, HR 81, O2 100% on room air. Patient given water and BP retaken at 4:07 132/85, HR 65, and O2 100%. Patient states she had chai tea and cassava for breakfast, no lunch. Then she had 1 hour gtt, 50 grams glucose. Patient given carbohydrate snack in lab and more water and felt revived. Patient counseled on eating small protein snacks throughout the day, protein foods reviewed with patient, and patient counseled to eat a protein snack when she gets home. Patient states understanding. Patient given letter from provider to take to work and counseled to bring her job description with her on 03/06/22 when she comes with her children for flu vaccines, and she can bring job description to maternity clinic for provider to review. Patient states understanding. TC to A. Widderich to discuss needs for dental care--plan to try to coordinate care for kids at [facility] and for mom at other location. Call to patient ride service and patient taken down to ground floor to wait for ride. Patient will return for MH RV on 03/24/22 on her day off.Jenetta Downer, RN

## 2022-02-27 NOTE — Progress Notes (Signed)
[place] Department  Maternal Health Clinic   INITIAL PRENATAL VISIT NOTE  Subjective:  Lori Richards is a 36 y.o. [id number] at 4w0dbeing seen today [facility]  She is currently monitored for the following issues for this high-risk pregnancy and has Mooren's corneal ulcer, right eye; Supervision of normal first pregnancy, antepartum; Antepartum multigravida of advanced maternal age; Obesity affecting pregnancy, antepartum; and H/O cesarean section on their problem list.  Patient reports vomiting and back pain at work .  Contractions: Not present. Vag. Bleeding: None.  Movement: Absent. Denies leaking of fluid.   Indications for ASA therapy (per uptodate) One of the following: Previous pregnancy with preeclampsia, especially early onset and with an adverse outcome No Multifetal gestation No Chronic hypertension No Type 1 or 2 diabetes mellitus No Chronic kidney disease No Autoimmune disease (antiphospholipid syndrome, systemic lupus erythematosus) No  Two or more of the following: Nulliparity No Obesity (body mass index >30 kg/m2) Yes Family history of preeclampsia in mother or sister No Age ?392years Yes Sociodemographic characteristics (African American race, low socioeconomic level) Yes Personal risk factors (eg, previous pregnancy with low birth weight or small for gestational age infant, previous adverse pregnancy outcome [eg, stillbirth], interval >10 years between pregnancies) No   The following portions of the patient's history were reviewed and updated as appropriate: allergies, current medications, past family history, past medical history, past social history, past surgical history and problem list. Problem list updated.  Objective:   Vitals:   02/27/22 1413  BP: 113/76  Pulse: 77  Temp: 98 F (36.7 C)  Weight: 162 lb 9.6 oz (73.8 kg)    Fetal Status: Fetal Heart Rate (bpm): 168 Fundal Height:  12 cm Movement: Absent  Presentation: Undeterminable   Physical Exam Vitals and nursing note reviewed.  Constitutional:      General: She is not in acute distress.    Appearance: Normal appearance. She is well-developed. She is obese.     Comments: Short stature  HENT:     Head: Normocephalic and atraumatic.     Right Ear: External ear normal.     Left Ear: External ear normal.     Nose: Nose normal. No congestion or rhinorrhea.     Mouth/Throat:     Lips: Pink.     Mouth: Mucous membranes are moist.     Dentition: Normal dentition. No dental caries.     Pharynx: Oropharynx is clear. Uvula midline.     Comments: Dentition: fair dentition Eyes:     General: No scleral icterus.    Conjunctiva/sclera: Conjunctivae normal.     Comments: R eye with opaque protruding lens.  Neck:     Thyroid: No thyroid mass or thyromegaly.  Cardiovascular:     Rate and Rhythm: Normal rate and regular rhythm.     Pulses: Normal pulses.     Heart sounds: Normal heart sounds.     Comments: Extremities are warm and well perfused Pulmonary:     Effort: Pulmonary effort is normal.     Breath sounds: Normal breath sounds.  Chest:     Chest wall: No mass.  Breasts:    Tanner Score is 5.     Breasts are symmetrical.     Right: Normal. No mass, nipple discharge or skin change.     Left: Normal. No mass, nipple discharge or skin change.  Abdominal:     General: Abdomen is flat.  Palpations: Abdomen is soft.     Tenderness: There is no abdominal tenderness.     Comments: Vertical suprapubic surgical scar  Genitourinary:    General: Normal vulva.     Exam position: Lithotomy position.     Pubic Area: No rash.      Labia:        Right: No rash.        Left: No rash.      Vagina: Normal. No vaginal discharge.     Cervix: No cervical motion tenderness or friability.     Uterus: Normal. Not enlarged (Gravid 10-12 week size) and not tender.      Adnexa: Right adnexa normal and left adnexa normal.      Rectum: Normal. No external hemorrhoid.  Musculoskeletal:     Right lower leg: No edema.     Left lower leg: No edema.  Lymphadenopathy:     Cervical: No cervical adenopathy.     Upper Body:     Right upper body: No axillary adenopathy.     Left upper body: No axillary adenopathy.  Skin:    General: Skin is warm.     Capillary Refill: Capillary refill takes less than 2 seconds.  Neurological:     Mental Status: She is alert and oriented to person, place, and time.     Assessment and Plan:  Pregnancy: [id number] at [redacted]w[redacted]d 1. Supervision of normal first pregnancy, antepartum Preg dating per 5105w5dSKoreaNote written limiting work to 8h/d, 40h/wk. Request full job description prior to further work restrictions requested at pt request (she describes back pain at work.) - Pregnancy, Initial Screen - Lead, blood (adult age 371rs or greater) - MaterniT21 PLUS Core - IGP, Aptima HPV - Hemoglobin, fingerstick  2. Antepartum multigravida of advanced maternal age Pt desires genetic counseling referral. [facility]  MatneriT 21 testing today. - aspirin EC 81 MG tablet; Take 1 tablet (81 mg total) by mouth daily. Swallow whole. - AMB MFM GENETICS REFERRAL  3. Obesity affecting pregnancy, antepartum, unspecified obesity type Counseled re: appropriate wt gain in pregnancy. Start daily low-dose aspirin at 12 wk. - Glucose tolerance, 1 hour - Protein / creatinine ratio, urine - TSH - Hgb A1c w/o eAG - Comprehensive metabolic panel - aspirin EC 81 MG tablet; Take 1 tablet (81 mg total) by mouth daily. Swallow whole.  4. H/O cesarean section Plan repeat C/S with delivery planning at 32 wks.  5. Nausea and vomiting of pregnancy, antepartum Discussed non-pharmacologic strategies, written info given. Pt declines Rx antiemetics at present.   Discussed overview of care and coordination with inpatient delivery practiceAOB,, KeJefm Bryantnd  UNKaser  Due to language  barrier, an interpreter ([phone] present via phone during the history-taking and subsequent discussion (and for part of the physical exam) with this patient.   Preterm labor symptoms and general obstetric precautions including but not limited to vaginal bleeding, contractions, leaking of fluid and fetal movement were reviewed in detail with the patient.  Please refer to After Visit Summary for other counseling recommendations.   Return in about 4 weeks (around 03/27/2022) for Routine prenatal care.  Future Appointments  Date Time Provider DeMartinsdale11/12/2021  8:20 AM AC-CD NURSE AC-CD None  03/24/2022 10:30 AM AC-MH PROVIDER AC-MAT None    AnLora HavensPA-C

## 2022-02-27 NOTE — Progress Notes (Signed)
Patient here for new OB appointment at 11 weeks. Patient states taking Tylenol '500mg'$  x 2 each day for back pain and lower left abdominal pain due to pushing boxes at work. Would like a note from provider to do a different job. Patient lives with her husband and their 2 children and her step-daughter. She and her family have a scheduled appointment for flu vaccines on 03/06/22 and she will have her flu vaccine at that time. Needs High BMI labs today and desires Maternit21. Has had 2 [facility] at [facility].Jenetta Downer, RN

## 2022-03-05 LAB — IGP, APTIMA HPV
HPV Aptima: NEGATIVE
PAP Smear Comment: 0

## 2022-03-06 ENCOUNTER — Ambulatory Visit (LOCAL_COMMUNITY_HEALTH_CENTER): Payer: Medicaid Other

## 2022-03-06 ENCOUNTER — Other Ambulatory Visit: Payer: Self-pay | Admitting: Physician Assistant

## 2022-03-06 DIAGNOSIS — Z3400 Encounter for supervision of normal first pregnancy, unspecified trimester: Secondary | ICD-10-CM

## 2022-03-06 DIAGNOSIS — Z23 Encounter for immunization: Secondary | ICD-10-CM | POA: Diagnosis not present

## 2022-03-06 DIAGNOSIS — Z719 Counseling, unspecified: Secondary | ICD-10-CM

## 2022-03-06 NOTE — Progress Notes (Addendum)
Patient seen for the following immunizations: Flu VIS forms given. NCIR immunization copy given. Tolerated well. [place] Language line used, Paramedic name and badge number:  Collins [place] [phone]

## 2022-03-06 NOTE — Addendum Note (Signed)
Addended byYetta Numbers, Chrissa Meetze R on: 03/06/2022 01:03 PM   Modules accepted: Level of Service

## 2022-03-11 LAB — PREGNANCY, INITIAL SCREEN
Antibody Screen: NEGATIVE
Basophils Absolute: 0.1 10*3/uL (ref 0.0–0.2)
Basos: 1 %
Bilirubin, UA: NEGATIVE
Chlamydia trachomatis, NAA: NEGATIVE
EOS (ABSOLUTE): 0.1 10*3/uL (ref 0.0–0.4)
Eos: 2 %
Glucose, UA: NEGATIVE
HCV Ab: NONREACTIVE
HIV Screen 4th Generation wRfx: NONREACTIVE
Hematocrit: 35.9 % (ref 34.0–46.6)
Hemoglobin: 12.1 g/dL (ref 11.1–15.9)
Hepatitis B Surface Ag: NEGATIVE
Immature Grans (Abs): 0 10*3/uL (ref 0.0–0.1)
Immature Granulocytes: 0 %
Ketones, UA: NEGATIVE
Leukocytes,UA: NEGATIVE
Lymphocytes Absolute: 1.9 10*3/uL (ref 0.7–3.1)
Lymphs: 37 %
MCH: 28.3 pg (ref 26.6–33.0)
MCHC: 33.7 g/dL (ref 31.5–35.7)
MCV: 84 fL (ref 79–97)
Monocytes Absolute: 0.4 10*3/uL (ref 0.1–0.9)
Monocytes: 7 %
Neisseria Gonorrhoeae by PCR: NEGATIVE
Neutrophils Absolute: 2.8 10*3/uL (ref 1.4–7.0)
Neutrophils: 53 %
Nitrite, UA: NEGATIVE
Platelets: 233 10*3/uL (ref 150–450)
RBC, UA: NEGATIVE
RBC: 4.27 x10E6/uL (ref 3.77–5.28)
RDW: 15.5 % — ABNORMAL HIGH (ref 11.7–15.4)
RPR Ser Ql: NONREACTIVE
Rh Factor: POSITIVE
Rubella Antibodies, IGG: 19.9 index (ref >0.99)
Specific Gravity, UA: 1.027 (ref 1.005–1.030)
Urobilinogen, Ur: 1 mg/dL (ref 0.2–1.0)
WBC: 5.2 10*3/uL (ref 3.4–10.8)
pH, UA: 7.5 (ref 5.0–7.5)

## 2022-03-11 LAB — MICROSCOPIC EXAMINATION
Casts: NONE SEEN /lpf
RBC, Urine: NONE SEEN /hpf (ref 0–2)
WBC, UA: NONE SEEN /hpf (ref 0–5)

## 2022-03-11 LAB — COMPREHENSIVE METABOLIC PANEL
ALT: 9 IU/L (ref 0–32)
AST: 11 IU/L (ref 0–40)
Albumin/Globulin Ratio: 1.2 (ref 1.2–2.2)
Albumin: 4.1 g/dL (ref 3.9–4.9)
Alkaline Phosphatase: 63 IU/L (ref 44–121)
BUN/Creatinine Ratio: 13 (ref 9–23)
BUN: 7 mg/dL (ref 6–20)
Bilirubin Total: <0.2 mg/dL (ref 0.0–1.2)
CO2: 22 mmol/L (ref 20–29)
Calcium: 9 mg/dL (ref 8.7–10.2)
Chloride: 101 mmol/L (ref 96–106)
Creatinine, Ser: 0.54 mg/dL — ABNORMAL LOW (ref 0.57–1.00)
Globulin, Total: 3.3 g/dL (ref 1.5–4.5)
Glucose: 109 mg/dL — ABNORMAL HIGH (ref 70–99)
Potassium: 4.3 mmol/L (ref 3.5–5.2)
Sodium: 136 mmol/L (ref 134–144)
Total Protein: 7.4 g/dL (ref 6.0–8.5)
eGFR: 124 mL/min/{1.73_m2} (ref >59)

## 2022-03-11 LAB — HCV INTERPRETATION

## 2022-03-11 LAB — GLUCOSE TOLERANCE, 1 HOUR: Glucose, 1Hr PP: 105 mg/dL (ref 70–199)

## 2022-03-11 LAB — SPECIMEN STATUS REPORT

## 2022-03-11 LAB — MATERNIT 21 PLUS CORE, BLOOD

## 2022-03-11 LAB — PROTEIN / CREATININE RATIO, URINE
Creatinine, Urine: 179.4 mg/dL
Protein, Ur: 14.6 mg/dL
Protein/Creat Ratio: 81 mg/g creat (ref 0–200)

## 2022-03-11 LAB — TSH: TSH: 0.628 u[IU]/mL (ref 0.450–4.500)

## 2022-03-11 LAB — HGB A1C W/O EAG: Hgb A1c MFr Bld: 5.9 % — ABNORMAL HIGH (ref 4.8–5.6)

## 2022-03-11 LAB — URINE CULTURE, OB REFLEX: Organism ID, Bacteria: NO GROWTH

## 2022-03-11 LAB — LEAD, BLOOD (ADULT >= 16 YRS): Lead-Whole Blood: 1.3 ug/dL (ref 0.0–3.4)

## 2022-03-11 NOTE — Progress Notes (Signed)
Patient is scheduled for U/S and genetic counseling at [facility], on 03/18/22, U/S at 10:00am and GC at 11:00am. Ride is arranged for this appointment. Patient pick up will be between 9:05 and 9:35 am and return trip scheduled for 1:30pm. Confirmation number for patient is [id number]. Marland KitchenJenetta Downer, RN

## 2022-03-12 ENCOUNTER — Telehealth: Payer: Self-pay

## 2022-03-12 NOTE — Telephone Encounter (Signed)
Call to client with [place] interpreter to notify her:            1.   Work papers ready for pick up 2.   [facility] [place] appt scheduled for 03/18/2022 at 1000 with        genetic counseling to follow at 1100 3.   Transportation has been arranged for [place] appt and will be picked up        between 0905 and 0935. Give transportation code to client: [phone]  Per recorded message, client's voicemail box is full. Per EPIC, client's emergency contacts speak [place]. Call to spouse with [facility] [employer]pre[employer] ID # [phone] and voicemail box is full. Call to brother with same interpreter and recorded message indicates number has been changed, disconnected or no longer in service. Rich Number, RN

## 2022-03-12 NOTE — Telephone Encounter (Signed)
Per result note of E. Sciora CNM, investigate specimen status report and why MaterniT21 test was cancelled. Per [employer], unsure why specimen status report indicates lab accident. Representative states MaternitT21 test was cancelled as specimen not received for testing. Ola Spurr CNM notified of above. Rich Number, RN

## 2022-03-13 ENCOUNTER — Other Ambulatory Visit: Payer: Self-pay

## 2022-03-13 DIAGNOSIS — Z98891 History of uterine scar from previous surgery: Secondary | ICD-10-CM

## 2022-03-13 NOTE — Telephone Encounter (Signed)
Call to client with [facility] [employer]pre[employer] ID # [phone] to notify her of [place] appt / provide transportation confirmation code / pick up work papers. Per recorded message, mailbox is full. Call to spouse with [place] interpreter ID # [phone] and per recorded message, voicemail box is full. Same interpreter used to call brother and per recorded message, number is not in service. Rich Number, RN

## 2022-03-13 NOTE — Telephone Encounter (Signed)
Secure chat sent to R. Karrie Doffing MSW, [facility] Care Manager to ascertain if she has any other contact numbers for client. Rich Number, RN

## 2022-03-14 NOTE — Telephone Encounter (Signed)
Per secure chat from R. Karrie Doffing MSW, OBCM client has phone number [phone] in [facility] and she called and left a message on that number. Number added to client's demographic screen. Rich Number, RN

## 2022-03-17 NOTE — Telephone Encounter (Signed)
On Friday 11/17 I was able to send Client message via what's app to her phone number  [phone].  She confirmed that she was available for the appointment on 11/21.  Confirmation code for transportation was sent to her.   Client again confirmed she could go to appointment.  Careem Yasui Shelda Pal, RN

## 2022-03-18 ENCOUNTER — Ambulatory Visit: Payer: Self-pay | Admitting: Maternal & Fetal Medicine

## 2022-03-18 ENCOUNTER — Ambulatory Visit: Payer: Medicaid Other

## 2022-03-18 ENCOUNTER — Other Ambulatory Visit: Payer: Self-pay

## 2022-03-18 ENCOUNTER — Other Ambulatory Visit
Admission: RE | Admit: 2022-03-18 | Discharge: 2022-03-18 | Disposition: A | Discharge status: Home / Self Care | Payer: Medicaid Other | Source: Ambulatory Visit | Attending: Maternal & Fetal Medicine | Admitting: Maternal & Fetal Medicine

## 2022-03-18 ENCOUNTER — Ambulatory Visit (HOSPITAL_BASED_OUTPATIENT_CLINIC_OR_DEPARTMENT_OTHER): Payer: Medicaid Other

## 2022-03-18 DIAGNOSIS — O09521 Supervision of elderly multigravida, first trimester: Secondary | ICD-10-CM | POA: Insufficient documentation

## 2022-03-18 DIAGNOSIS — O99211 Obesity complicating pregnancy, first trimester: Secondary | ICD-10-CM | POA: Insufficient documentation

## 2022-03-18 DIAGNOSIS — Z3400 Encounter for supervision of normal first pregnancy, unspecified trimester: Secondary | ICD-10-CM | POA: Insufficient documentation

## 2022-03-18 DIAGNOSIS — Z3A13 13 weeks gestation of pregnancy: Secondary | ICD-10-CM | POA: Diagnosis not present

## 2022-03-18 DIAGNOSIS — O34219 Maternal care for unspecified type scar from previous cesarean delivery: Secondary | ICD-10-CM | POA: Diagnosis not present

## 2022-03-18 NOTE — Progress Notes (Signed)
Referring Provider:  Lora Havens Length of Consultation: 40 minutes  [name] was referred to Maternal Fetal Care at [facility] for genetic counseling because of advanced maternal age.  The patient will be 36 years old at the time of delivery.  This note summarizes the information we discussed with the aid of a Swahili interpreter.  The patient was counseled by Criss Rosales, genetic counseling intern, supervised by Donette Larry, MS, CGC.    We explained that the chance of a chromosome abnormality increases with maternal age.  Chromosomes and examples of chromosome problems were reviewed.  Humans typically have 46 chromosomes in each cell, with half passed through each sperm and egg.  Any change in the number or structure of chromosomes can increase the risk of problems in the physical and mental development of a pregnancy.   Based upon age of the patient and the current gestational age, the chance of any chromosome abnormality was 1 in 83. The chance of Down syndrome, the most common chromosome problem associated with maternal age, was 1 in 107.  The risk of chromosome problems is in addition to the 3% general population risk for birth defects and intellectual disabilities.  The greatest chance, of course, is that the baby would be born in good health.  We discussed the following prenatal screening and testing options for this pregnancy:  Cell free fetal DNA testing analyzes maternal blood to determine whether or not the baby may have Down syndrome, trisomy 25, or trisomy 70.  This test utilizes a maternal blood sample and DNA sequencing technology to isolate circulating cell free fetal DNA from maternal plasma.  The fetal DNA can then be analyzed for DNA sequences that are derived from the three most common chromosomes involved in aneuploidy, chromosomes 13, 18, and 21.  If the overall amount of DNA is greater than the expected level for any of these chromosomes, aneuploidy is  suspected.  While we do not consider it a replacement for invasive testing and karyotype analysis, a negative result from this testing would be reassuring, though not a guarantee of a normal chromosome complement for the baby.  An abnormal result is certainly suggestive of an abnormal chromosome complement, though we would still recommend CVS or amniocentesis to confirm any findings from this testing.  [name] had prior cell free fetal DNA testing drawn by her OB, but the sample was not received in the lab.  Therefore we offered her the option of a repeat sample to be drawn today.   Maternal serum marker screening, a blood test that measures pregnancy proteins, can provide risk assessments for Down syndrome, trisomy 18, and open neural tube defects (spina bifida, anencephaly). Because it does not directly examine the fetus, it cannot positively diagnose or rule out these problems. The detection rate is approximately 75% for Down syndrome, 70% for trisomy 18 and 80% of open neural tube defects. If prior chromosome screening has been performed, then AFP only is recommended to test for open neural tube defects alone in the second trimester.  Targeted ultrasound uses high frequency sound waves to create an image of the developing fetus.  An ultrasound is often recommended as a routine means of evaluating the pregnancy.  It is also used to screen for fetal anatomy problems (for example, a heart defect) that might be suggestive of a chromosomal or other abnormality.   Amniocentesis involves the removal of a small amount of amniotic fluid from the sac surrounding the fetus with  the use of a thin needle inserted through the maternal abdomen and uterus.  Ultrasound guidance is used throughout the procedure.  Fetal cells from amniotic fluid are directly evaluated and > 99.5% of chromosome problems and > 98% of open neural tube defects can be detected. This procedure is generally performed after the 15th week  of pregnancy.  The main risks to this procedure include complications leading to miscarriage in less than 1 in 200 cases (0.5%).  Cystic Fibrosis and Spinal Muscular Atrophy (SMA) screening were also discussed with the patient. Both conditions are recessive, which means that both parents must be carriers in order to have a child with the disease.  Cystic fibrosis (CF) is one of the most common genetic conditions in persons of Caucasian ancestry.  This condition occurs in approximately 1 in 2,500 Caucasian persons and results in thickened secretions in the lungs, digestive, and reproductive systems.  For a baby to be at risk for having CF, both of the parents must be carriers for this condition.  Approximately 1 in 54 Caucasian persons is a carrier for CF.  Current carrier testing looks for the most common mutations in the gene for CF and can detect approximately 90% of carriers in the Caucasian population.  This means that the carrier screening can greatly reduce, but cannot eliminate, the chance for an individual to have a child with CF.  If an individual is found to be a carrier for CF, then carrier testing would be available for the partner. As part of [place] newborn screening profile, all babies born in the state of [place] will have a two-tier screening process.  Specimens are first tested to determine the concentration of immunoreactive trypsinogen (IRT).  The top 5% of specimens with the highest IRT values then undergo DNA testing using a panel of over 40 common CF mutations. SMA is a neurodegenerative disorder that leads to atrophy of skeletal muscle and overall weakness.  This condition is also more prevalent in the Caucasian population, with 1 in 40-1 in 60 persons being a carrier and 1 in 6,000-1 in 10,000 children being affected.  There are multiple forms of the disease, with some causing death in infancy to other forms with survival into adulthood.  The genetics of SMA is complex, but  carrier screening can detect up to 95% of carriers in the Caucasian population.  Similar to CF, a negative result can greatly reduce, but cannot eliminate, the chance to have a child with SMA.  Hemoglobinopathy screening was also offered to the patient.  We obtained a detailed family history and pregnancy history. This is the fourth pregnancy for this patient and her husband, Lori Richards.  She reported no complications in the current pregnancy and no exposure to medications, alcohol, tobacco or recreational drugs during this pregnancy. They have two healthy children and had one early miscarriage.  Lori Richards also has a 18 year old daughter who is in good health.  The remainder of the family history is unremarkable for birth defects, developmental delays, or known genetic conditions. The patient stated that her mother had several miscarriages (possibly 30) at various gestational ages, but that she went on to have 7 healthy children, none of whom have experienced recurrent miscarriages.  Because miscarriage may occur for many reasons, including some genetic factors, we would need additional information to determine if any testing for genetic causes of pregnancy loss would be needed.  An ultrasound was performed at the time of the visit.  The gestational  age was consistent with  13 weeks, 5 days.  Fetal anatomy could not be assessed due to early gestational age.  Please refer to the ultrasound report for details of that study.  [name] was encouraged to call with questions or concerns.  We can be contacted at [phone].  Plan of Care: Labs drawn today for Lori Richards with [facility] for aneuploidy screening.  The patient also elected carrier screening for CF, SMA and hemoglobinopathies.  Results will be communicated to the patient in 1-2 weeks. Scheduled to return to clinic at 18-19 weeks for detailed fetal anatomy ultrasound. msAFP only to be drawn by OB if desired for spina bifida screening (after [redacted]  weeks gestation).  Lori Finlay, MS, CGC

## 2022-03-20 LAB — HGB FRACTIONATION CASCADE
Hgb A2: 2.5 % (ref 1.8–3.2)
Hgb A: 97.5 % (ref 96.4–98.8)
Hgb F: 0 % (ref 0.0–2.0)
Hgb S: 0 %

## 2022-03-22 LAB — MATERNIT21 PLUS CORE+SCA
Fetal Fraction: 10
Monosomy X (Turner Syndrome): NOT DETECTED
Result (T21): NEGATIVE
Trisomy 13 (Patau syndrome): NEGATIVE
Trisomy 18 (Edwards syndrome): NEGATIVE
Trisomy 21 (Down syndrome): NEGATIVE
XXX (Triple X Syndrome): NOT DETECTED
XXY (Klinefelter Syndrome): NOT DETECTED
XYY (Jacobs Syndrome): NOT DETECTED

## 2022-03-24 ENCOUNTER — Encounter: Payer: Self-pay | Admitting: Advanced Practice Midwife

## 2022-03-24 ENCOUNTER — Ambulatory Visit: Payer: Medicaid Other | Admitting: Advanced Practice Midwife

## 2022-03-24 VITALS — BP 101/65 | HR 81 | Temp 98.0°F | Wt 162.6 lb

## 2022-03-24 DIAGNOSIS — O09522 Supervision of elderly multigravida, second trimester: Secondary | ICD-10-CM

## 2022-03-24 DIAGNOSIS — H16051 Mooren's corneal ulcer, right eye: Secondary | ICD-10-CM

## 2022-03-24 DIAGNOSIS — N83209 Unspecified ovarian cyst, unspecified side: Secondary | ICD-10-CM

## 2022-03-24 DIAGNOSIS — Z3400 Encounter for supervision of normal first pregnancy, unspecified trimester: Secondary | ICD-10-CM

## 2022-03-24 DIAGNOSIS — O3480 Maternal care for other abnormalities of pelvic organs, unspecified trimester: Secondary | ICD-10-CM

## 2022-03-24 DIAGNOSIS — O99210 Obesity complicating pregnancy, unspecified trimester: Secondary | ICD-10-CM

## 2022-03-24 DIAGNOSIS — Z3402 Encounter for supervision of normal first pregnancy, second trimester: Secondary | ICD-10-CM

## 2022-03-24 DIAGNOSIS — Z98891 History of uterine scar from previous surgery: Secondary | ICD-10-CM

## 2022-03-24 DIAGNOSIS — O09529 Supervision of elderly multigravida, unspecified trimester: Secondary | ICD-10-CM

## 2022-03-24 DIAGNOSIS — D259 Leiomyoma of uterus, unspecified: Secondary | ICD-10-CM | POA: Insufficient documentation

## 2022-03-24 DIAGNOSIS — O99212 Obesity complicating pregnancy, second trimester: Secondary | ICD-10-CM

## 2022-03-24 HISTORY — DX: Leiomyoma of uterus, unspecified: D25.9

## 2022-03-24 NOTE — Progress Notes (Addendum)
[place] Department Maternal Health Clinic  PRENATAL VISIT NOTE  Subjective:  Lori Richards is a 36 y.o. [phone] at 65w4dbeing seen today for ongoing prenatal care.  She is currently monitored for the following issues for this high-risk pregnancy and has Mooren's corneal ulcer, right eye; Supervision of normal first pregnancy, antepartum; Antepartum multigravida of advanced maternal age 36yo; Obesity affecting pregnancy, antepartum BMI=31.6; and H/O cesarean section x2 classical incisions on their problem list.  Patient reports no complaints.  Contractions: Not present. Vag. Bleeding: None.  Movement: Absent. Denies leaking of fluid/ROM.   The following portions of the patient's history were reviewed and updated as appropriate: allergies, current medications, past family history, past medical history, past social history, past surgical history and problem list. Problem list updated.  Objective:   Vitals:   03/24/22 1034  BP: 101/65  Pulse: 81  Temp: 98 F (36.7 C)  Weight: 162 lb 9.6 oz (73.8 kg)    Fetal Status: Fetal Heart Rate (bpm): 130 Fundal Height: 18 cm Movement: Absent     General:  Alert, oriented and cooperative. Patient is in no acute distress.  Skin: Skin is warm and dry. No rash noted.   Cardiovascular: Normal heart rate noted  Respiratory: Normal respiratory effort, no problems with respiration noted  Abdomen: Soft, gravid, appropriate for gestational age.  Pain/Pressure: Present     Pelvic: Cervical exam deferred        Extremities: Normal range of motion.  Edema: None  Mental Status: Normal mood and affect. Normal behavior. Normal judgment and thought content.   Assessment and Plan:  Pregnancy: [id number] 148w4d1. Supervision of normal first pregnancy, antepartum Reviewed 02/06/22 u/s at 8 5/7 with EDC=09/13/22 and left benign ovarian cyst 2.7x2.6x2.6 cm with no f/u needed per ultrasonographer Reviewed 03/18/22 MFM u/s + genetic  counseling done  at 14 1/7  with posterior fibroid 3.4x3.3x3.2 cm with recommendation of serial growth u/s 28 wks and q 4 wks until birth [facility] Next u/s 04/29/22 Pap 02/27/22 neg HPV neg Ran out of vits 1 week ago--more given NIPS 03/18/22=neg  2. Obesity affecting pregnancy, antepartum, unspecified obesity type -2 lb 11.9 oz (-1.245 kg) Pt has not gotten ASA 81 mg yet but plans to do so  today  3. H/O cesarean section x2 classical incisions Per BuValeda MalmDO at 03/18/22 u/s recommended repeat c/s at 37 wks  4. Mooren's corneal ulcer, right eye [facility] eyed[facility] but when she went to fill rx, she was told it couldn't be filled because that eye doctor wasn't licensed; she was also told that the eye doctor will perform surgery on her eye 10/2022 and can't do sooner because she is pregnant RN to have AmFreeman CaldronRN schedule another eye apt at another facility for pt (due to refugee Medicaid restrictions) Referral written  5. Antepartum multigravida of advanced maternal age [id number] Genetic counseling done 03/18/22 Hasn't bought ASA 81 mg yet but states will do so today   Preterm labor symptoms and general obstetric precautions including but not limited to vaginal bleeding, contractions, leaking of fluid and fetal movement were reviewed in detail with the patient. Please refer to After Visit Summary for other counseling recommendations.  Return in about 4 weeks (around 04/21/2022) for routine PNC.  Future Appointments  Date Time Provider DeClarkdale12/21/2023 10:00 AM AC-MH PROVIDER AC-MAT None  04/29/2022 11:00 AM [facility] US1  [facility] [place], CNM

## 2022-03-24 NOTE — Progress Notes (Signed)
Patient here for MH RV at 14 4/7. Patient contact information verified. Paperwork for work given and letter read to patient.Jenetta Downer, RN

## 2022-03-25 ENCOUNTER — Telehealth: Payer: Self-pay | Admitting: Obstetrics and Gynecology

## 2022-03-25 NOTE — Telephone Encounter (Signed)
Called patient with the aid of a Swahili interpreter to review lab results.  Left message for the patient to contact our clinic to discuss at [phone].  Wilburt Finlay, MS, CGC

## 2022-03-26 ENCOUNTER — Telehealth: Payer: Self-pay

## 2022-03-26 MED ORDER — PRENATAL VITAMIN 27-0.8 MG PO TABS: 1.0000 | ORAL_TABLET | Freq: Every day | ORAL | 0 refills | Status: DC

## 2022-03-26 NOTE — Addendum Note (Signed)
Addended by: Jenetta Downer on: 03/26/2022 02:33 PM   Modules accepted: Orders

## 2022-03-26 NOTE — Progress Notes (Signed)
Patient stated at visit that she was running out of PNV, given more at appointment on 03/24/22.Marland KitchenJenetta Downer, RN

## 2022-03-26 NOTE — Telephone Encounter (Signed)
TC to client to discuss upcoming appointment at [facility] on 04/17/22, and [facility] U/S appointment on 04/29/22. Patient was given information about how to schedule transportation for appointments and patient was called to find out if she has been able to schedule transportation or if she needs assistance. VM is full on client cell phone. LM at home phone number. LM with number to call. Also patient is in contact with A. Widderich through "What's App". Jenetta Downer, RN

## 2022-03-27 ENCOUNTER — Telehealth: Payer: Self-pay

## 2022-03-27 NOTE — Telephone Encounter (Signed)
TC to [facility] to request clarification regarding patient prescription from 02/19/22 for eye drops (prednisolone acetate). At her most recent appointment, patient stated that she had gone to the pharmacy to pick up the eye drops and was told that the MD who prescribed them did not have prescribing privileges in [place]. Per Tito Dine at [facility] she will message Dr. Stacey Drain and ask him to take care of the situation so that patient can get her eye drops. Jenetta Downer, RN

## 2022-03-31 NOTE — Telephone Encounter (Signed)
Patient transportation is arranged for next RV, per A. Eugenio Hoes, RN. Marland KitchenJenetta Downer, RN

## 2022-04-01 ENCOUNTER — Telehealth: Payer: Self-pay | Admitting: Obstetrics and Gynecology

## 2022-04-01 NOTE — Telephone Encounter (Signed)
I have been unable to reach Lori Richards by phone with her lab results and voicemail is full.  The results of the genetic testing are normal, as follows:  The repeated MaterniT21 testing yielded NEGATIVE results.  The patient's specimen showed DNA consistent with two copies of chromosomes 21, 18 and 13.  The sensitivity for trisomy 47, trisomy 92 and trisomy 57 using this testing are reported as 99.1%, 99.9% and 91.7% respectively.  Thus, while the results of this testing are highly accurate, they are not considered diagnostic at this time.  Should more definitive information be desired, the patient may still consider amniocentesis.   As requested by the patient, sex chromosome analysis was included for this sample.  Results are can be found in the report or by calling our office. This is predicted with >99% accuracy. This testing also screens for sex chromosome conditions with greater than 96% accuracy and was negative for those conditions.   [name] also elected to have carrier screening with the Inheritest Core Panel which includes Cystic fibrosis and spinal muscular atrophy.  The results are now available and are within normal limits and were communicated to the patient as follows:  To review, CF is a genetic condition that occurs most often in Caucasian persons.  It primarily affects the lungs, digestive, and reproductive systems.  For someone to be at risk for having CF, both of their parents must be carriers for CF.  The testing can detect many persons who are carriers for CF and therefore determine if the pregnancy is at an increased risk for this condition.   No changes in the gene for CF were identified on this screening, which greatly reduces the chance that this patient is a carrier for this condition.  Because this testing cannot detect all changes that may cause CF, we cannot eliminate the chance that this individual is a carrier completely.  The results of the SMA carrier screening are  also negative.  SMA is also a recessive genetic condition with variable age of onset and severity caused by mutations in the SMN1 gene.  This carrier testing assesses the number of copies of this gene.  Persons with one copy of the SMN1 gene are carriers, and those with no copies are affected with the condition.  Individuals with two or more copies have a reduced chance to be a carrier.  Not all mutations can be detected with this testing, though it can detect 95% of carriers in the Caucasian population.  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 *3+80T>G SNP, thus greatly reducing her chance to be a carrier.  Again, this testing cannot eliminate the chance to have a child with SMA, but dramatically reduces the chance.    A maternal serum AFP only should be considered if screening for neural tube defects is desired.  We may be reached at [phone] with any questions or concerns.  Wilburt Finlay, MS, CGC

## 2022-04-02 LAB — MISC LABCORP TEST (SEND OUT): Labcorp test code: 481758

## 2022-04-07 ENCOUNTER — Telehealth: Payer: Self-pay

## 2022-04-07 NOTE — Telephone Encounter (Signed)
Patient informed of transportation for her and her husbands dental appt on 05/05/2021 at 945am, will be picked up around 9am.

## 2022-04-15 ENCOUNTER — Telehealth: Payer: Self-pay

## 2022-04-15 NOTE — Telephone Encounter (Signed)
Call to [facility] to ascertain if client has appt scheduled in response to referral faxed 03/25/2022 with confirmation received.   Per Bonnita Nasuti, client does not have appt scheduled. She states practice does not accept her [facility]. (Practice also does not accept [facility] Medicaid). Above noted on pink sticky. Rich Number, RN

## 2022-04-17 ENCOUNTER — Telehealth: Payer: Self-pay

## 2022-04-17 ENCOUNTER — Ambulatory Visit: Payer: Medicaid Other

## 2022-04-17 DIAGNOSIS — Z3400 Encounter for supervision of normal first pregnancy, unspecified trimester: Secondary | ICD-10-CM

## 2022-04-17 NOTE — Telephone Encounter (Signed)
[facility] for [place] RV 04/17/2022 and per appt note line, transportation arranged. Call to client with [facility] [employer] ID # [phone] who states could not get transportation arranged for appt today. Client has [facility] appt 04/29/22 and states has dental appt 05/05/2022. [facility] appt rescheduled for 05/02/2021. Client requested assistance with transportation for 04/29/2022 and 05/02/2022 appts and K. Brewer-Jensen RN, [facility] Coordinator aware and client counseled she would contact her with transportation information. Rich Number, RN

## 2022-04-23 ENCOUNTER — Other Ambulatory Visit: Payer: Self-pay

## 2022-04-23 DIAGNOSIS — Z98891 History of uterine scar from previous surgery: Secondary | ICD-10-CM

## 2022-04-23 DIAGNOSIS — O09521 Supervision of elderly multigravida, first trimester: Secondary | ICD-10-CM

## 2022-04-29 ENCOUNTER — Ambulatory Visit: Payer: Medicaid Other | Attending: Obstetrics and Gynecology

## 2022-04-29 ENCOUNTER — Other Ambulatory Visit: Payer: Self-pay

## 2022-04-29 DIAGNOSIS — Z98891 History of uterine scar from previous surgery: Secondary | ICD-10-CM

## 2022-04-29 DIAGNOSIS — Z3A19 19 weeks gestation of pregnancy: Secondary | ICD-10-CM | POA: Diagnosis not present

## 2022-04-29 DIAGNOSIS — Z363 Encounter for antenatal screening for malformations: Secondary | ICD-10-CM | POA: Diagnosis not present

## 2022-04-29 DIAGNOSIS — O34219 Maternal care for unspecified type scar from previous cesarean delivery: Secondary | ICD-10-CM

## 2022-04-29 DIAGNOSIS — Z3687 Encounter for antenatal screening for uncertain dates: Secondary | ICD-10-CM | POA: Insufficient documentation

## 2022-04-29 DIAGNOSIS — O09522 Supervision of elderly multigravida, second trimester: Secondary | ICD-10-CM | POA: Diagnosis not present

## 2022-04-29 DIAGNOSIS — O99212 Obesity complicating pregnancy, second trimester: Secondary | ICD-10-CM | POA: Diagnosis not present

## 2022-04-29 DIAGNOSIS — O99210 Obesity complicating pregnancy, unspecified trimester: Secondary | ICD-10-CM

## 2022-04-29 DIAGNOSIS — O09521 Supervision of elderly multigravida, first trimester: Secondary | ICD-10-CM

## 2022-05-02 ENCOUNTER — Ambulatory Visit: Payer: Medicaid Other | Admitting: Advanced Practice Midwife

## 2022-05-02 ENCOUNTER — Ambulatory Visit: Payer: Medicaid Other

## 2022-05-02 VITALS — BP 104/81 | HR 82 | Temp 98.5°F | Wt 164.2 lb

## 2022-05-02 DIAGNOSIS — D259 Leiomyoma of uterus, unspecified: Secondary | ICD-10-CM

## 2022-05-02 DIAGNOSIS — H18713 Corneal ectasia, bilateral: Secondary | ICD-10-CM | POA: Insufficient documentation

## 2022-05-02 DIAGNOSIS — O99212 Obesity complicating pregnancy, second trimester: Secondary | ICD-10-CM

## 2022-05-02 DIAGNOSIS — Z3402 Encounter for supervision of normal first pregnancy, second trimester: Secondary | ICD-10-CM

## 2022-05-02 DIAGNOSIS — Z3400 Encounter for supervision of normal first pregnancy, unspecified trimester: Secondary | ICD-10-CM

## 2022-05-02 DIAGNOSIS — Z98891 History of uterine scar from previous surgery: Secondary | ICD-10-CM

## 2022-05-02 DIAGNOSIS — H16051 Mooren's corneal ulcer, right eye: Secondary | ICD-10-CM

## 2022-05-02 DIAGNOSIS — O99210 Obesity complicating pregnancy, unspecified trimester: Secondary | ICD-10-CM

## 2022-05-02 NOTE — Progress Notes (Addendum)
[place] [facility]  PRENATAL VISIT NOTE  Subjective:  Lori Richards is a 37 y.o. [phone] at 28w1dbeing seen today for ongoing prenatal care.  She is currently monitored for the following issues for this high-risk pregnancy and has Mooren's corneal ulcer, right eye; Supervision of normal first pregnancy, antepartum; Antepartum multigravida of advanced maternal age 37 Obesity affecting pregnancy, antepartum BMI=31.6; H/O cesarean section x2 classical incisions; Uterine fibroid 3.4x3.3x3.2 cm on 03/18/22 u/s; and left benign ovarian cyst 2.7x2.6x2.6 cm on 02/06/22 u/s on their problem list.  Patient reports no complaints.  Contractions: Not present. Vag. Bleeding: None.  Movement: Present. Denies leaking of fluid/ROM.   The following portions of the patient's history were reviewed and updated as appropriate: allergies, current medications, past family history, past medical history, past social history, past surgical history and problem list. Problem list updated.  Objective:   Vitals:   05/02/22 1421  BP: 104/81  Pulse: 82  Temp: 98.5 F (36.9 C)  Weight: 164 lb 3.2 oz (74.5 kg)    Fetal Status: Fetal Heart Rate (bpm): 148 Fundal Height: 24 cm Movement: Present  Presentation: Undeterminable  General:  Alert, oriented and cooperative. Patient is in no acute distress.  Skin: Skin is warm and dry. No rash noted.   Cardiovascular: Normal heart rate noted  Respiratory: Normal respiratory effort, no problems with respiration noted  Abdomen: Soft, gravid, appropriate for gestational age.  Pain/Pressure: Absent     Pelvic: Cervical exam deferred        Extremities: Normal range of motion.  Edema: None  Mental Status: Normal mood and affect. Normal behavior. Normal judgment and thought content.   Assessment and Plan:  Pregnancy: [id number] 214w1d. Supervision of normal first pregnancy, antepartum Reviewed 04/29/22 anatomy U/S at 206w4dFW 91%,  AFI Wnl, Placenta is posterior, 3VC; recommended repeat c/s  Size> dates today Repeat growth u/s on 06/03/22  No complaints  - AFP, Serum, Open Spina Bifida  2. H/O cesarean section x2 classical incisions At U/S on 11/21, recommended to do repeat growth ultrasounds Q4 weeks starting at 24-28 weeks until delivery. Dr. BurValeda Malmecommended repeat c/s at 37 weeks. 04/29/22 U/S recommended repeat c/s at 39 weeks. Needs Delivery plans referral at 32 weeks '[ ]'$   3. Obesity affecting pregnancy, antepartum, unspecified obesity type -taking ASA daily -1 lb 2.3 oz (-0.519 kg) -Discussed weight loss- patient had decreased appetite- only likes to eat oatmeal and spinach -Denies vomiting- has been using wrist bands to help with nausea -per last U/S, EFW 91%, will monitor pt's weight  4. Uterine leiomyoma, unspecified location 3.4x3.3x3.2cm on 03/18/22  5. Mooren's corneal ulcer, right eye Prescription has now been filled for the eye drops. RN to assist with transportation so patient can get Rx  4. H/O cesarean section x2 classical incisions IOL paperwork needed about 34 weeks   Preterm labor symptoms and general obstetric precautions including but not limited to vaginal bleeding, contractions, leaking of fluid and fetal movement were reviewed in detail with the patient. Please refer to After Visit Summary for other counseling recommendations.  Return in about 4 weeks (around 05/30/2022) for Routine Prenatal Care.  Future Appointments  Date Time Provider DepWest Union/31/2024  1:20 PM AC-MH PROVIDER AC-MAT None  06/03/2022 10:00 AM [facility] US1 [facility] [place] Attestation of Supervision of Advanced Practitioner (CNM/PA/NP): Evaluation and management procedures were performed by the Advanced Practice Provider under my supervision  and collaboration.  I have reviewed the Advanced Practice Provider's note and chart, and I agree with the management and plan. I  have also made any necessary editorial changes.   I was working along side this practitioner all day and all medical plans were discussed with me.

## 2022-05-02 NOTE — Progress Notes (Signed)
Patient here for MH RV at [redacted]w[redacted]d   Patient consent to AFP spina bifida testing.   Patient states she could not pick up eye drops Rx [facility]prednisolone acetate) [facility] due to that prescribing provider not having prescription privileges.   KJoellen [place] RN [place] [facility] [address] [place] need [place] insurance information. The drops cost $66 without insurance.   Patient informed of change in pharmacy and need for her to take insurance card.   Message Araceli, RN to [facility] and [facility] [place].   EAl Decant RN

## 2022-05-05 LAB — AFP, SERUM, OPEN SPINA BIFIDA
AFP MoM: 0.98
AFP Value: 58.9 ng/mL
Gest. Age on Collection Date: 20.1 weeks
Maternal Age At EDD: 35.3 yr
OSBR Risk 1 IN: 10000
Test Results:: NEGATIVE
Weight: 164 [lb_av]

## 2022-05-16 ENCOUNTER — Telehealth: Payer: Self-pay

## 2022-05-16 NOTE — Telephone Encounter (Signed)
Discussed transportation with patient for 05/28/2022. Pick up time: 12:10pm and drop off time: 12:50pm for her appt at [facility]. Trip ref #: [id number]

## 2022-05-27 ENCOUNTER — Emergency Department: Payer: Medicaid Other

## 2022-05-27 ENCOUNTER — Emergency Department
Admission: EM | Admit: 2022-05-27 | Discharge: 2022-05-27 | Disposition: A | Discharge status: Home / Self Care | Payer: Medicaid Other | Attending: Emergency Medicine | Admitting: Emergency Medicine

## 2022-05-27 DIAGNOSIS — R55 Syncope and collapse: Secondary | ICD-10-CM | POA: Insufficient documentation

## 2022-05-27 DIAGNOSIS — R519 Headache, unspecified: Secondary | ICD-10-CM | POA: Insufficient documentation

## 2022-05-27 DIAGNOSIS — Z3A00 Weeks of gestation of pregnancy not specified: Secondary | ICD-10-CM | POA: Diagnosis not present

## 2022-05-27 DIAGNOSIS — R040 Epistaxis: Secondary | ICD-10-CM

## 2022-05-27 DIAGNOSIS — R42 Dizziness and giddiness: Secondary | ICD-10-CM

## 2022-05-27 DIAGNOSIS — O26899 Other specified pregnancy related conditions, unspecified trimester: Secondary | ICD-10-CM | POA: Insufficient documentation

## 2022-05-27 LAB — CBC WITH DIFFERENTIAL/PLATELET
Abs Immature Granulocytes: 0.02 10*3/uL (ref 0.00–0.07)
Basophils Absolute: 0.1 10*3/uL (ref 0.0–0.1)
Basophils Relative: 1 %
Eosinophils Absolute: 0.1 10*3/uL (ref 0.0–0.5)
Eosinophils Relative: 2 %
HCT: 36.2 % (ref 36.0–46.0)
Hemoglobin: 11.7 g/dL — ABNORMAL LOW (ref 12.0–15.0)
Immature Granulocytes: 0 %
Lymphocytes Relative: 30 %
Lymphs Abs: 1.8 10*3/uL (ref 0.7–4.0)
MCH: 28 pg (ref 26.0–34.0)
MCHC: 32.3 g/dL (ref 30.0–36.0)
MCV: 86.6 fL (ref 80.0–100.0)
Monocytes Absolute: 0.5 10*3/uL (ref 0.1–1.0)
Monocytes Relative: 8 %
Neutro Abs: 3.6 10*3/uL (ref 1.7–7.7)
Neutrophils Relative %: 59 %
Platelets: 181 10*3/uL (ref 150–400)
RBC: 4.18 MIL/uL (ref 3.87–5.11)
RDW: 14.7 % (ref 11.5–15.5)
WBC: 6 10*3/uL (ref 4.0–10.5)
nRBC: 0 % (ref 0.0–0.2)

## 2022-05-27 LAB — URINALYSIS, ROUTINE W REFLEX MICROSCOPIC
Bilirubin Urine: NEGATIVE
Glucose, UA: NEGATIVE mg/dL
Hgb urine dipstick: NEGATIVE
Ketones, ur: NEGATIVE mg/dL
Leukocytes,Ua: NEGATIVE
Nitrite: NEGATIVE
Protein, ur: NEGATIVE mg/dL
Specific Gravity, Urine: 1.001 — ABNORMAL LOW (ref 1.005–1.030)
pH: 7 (ref 5.0–8.0)

## 2022-05-27 LAB — BASIC METABOLIC PANEL
Anion gap: 3 — ABNORMAL LOW (ref 5–15)
BUN: 6 mg/dL (ref 6–20)
CO2: 21 mmol/L — ABNORMAL LOW (ref 22–32)
Calcium: 8 mg/dL — ABNORMAL LOW (ref 8.9–10.3)
Chloride: 110 mmol/L (ref 98–111)
Creatinine, Ser: 0.56 mg/dL (ref 0.44–1.00)
GFR, Estimated: >60 mL/min (ref >60)
Glucose, Bld: 89 mg/dL (ref 70–99)
Potassium: 3.7 mmol/L (ref 3.5–5.1)
Sodium: 134 mmol/L — ABNORMAL LOW (ref 135–145)

## 2022-05-27 LAB — TYPE AND SCREEN
ABO/RH(D): A POS
Antibody Screen: NEGATIVE

## 2022-05-27 LAB — MAGNESIUM: Magnesium: 2.1 mg/dL (ref 1.7–2.4)

## 2022-05-27 MED ORDER — SODIUM CHLORIDE 0.9 % IV SOLN: Freq: Once | INTRAVENOUS | Status: AC

## 2022-05-27 NOTE — ED Triage Notes (Signed)
Pt at work when she became dizzy had a nosebleed and reportedly had a syncopal episode. EMS did not notice any loss of conscious during their time with pt. Pt speaks Swahili . Pt pregnant with third child and has prenatal care at the health department. On baby aspirin for previous preeclampsia EMS vitals: BP 142/82 CBG 120 HR 75 NSR

## 2022-05-27 NOTE — ED Notes (Deleted)
Pt reported via interpreter she passed out and had vaginal bleeding

## 2022-05-27 NOTE — ED Provider Notes (Addendum)
[facility] Provider Note    Event Date/Time   First MD Initiated Contact with Patient 05/27/22 [phone]     (approximate)   History   Dizziness (Nose bleed, possible syncopal episode)  Formal translating service utilized  HPI  Lori Richards is a 37 y.o. female G4 P3 who presents to the emergency department following an episode of feeling like she needed to pass out.  Patient states that she was in her normal state of health this morning when she started having a nosebleed.  Then states that at 720 she had a sudden onset of headache.  Diffuse headache that caused her to feel very dizzy and like she might pass out.  Denies any head injury or loss of consciousness.  Took Tylenol at home.  Endorses some improvement of her headache since that time.  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  Denies any extremity numbness or weakness.  No recent vomiting or diarrhea.  No abdominal cramping or leakage of fluids.  Denies any burning with urination urinary urgency or frequency.  Good fetal heart movement.  No vaginal bleeding.  Follows with the health department.  Due date is  May 23.     Physical Exam   Triage Vital Signs: ED Triage Vitals [05/27/22 0906]  Enc Vitals Group     BP (!) 99/53     Pulse Rate 79     Resp (!) 24     Temp 98.1 F (36.7 C)     Temp Source Oral     SpO2 100 %     Weight      Height      Head Circumference      Peak Flow      Pain Score      Pain Loc      Pain Edu?      Excl. in [place]?     Most recent vital signs: Vitals:   05/27/22 0915 05/27/22 0930  BP: (!) 105/59 112/67  Pulse: 80 80  Resp: (!) 22 (!) 23  Temp:    SpO2: 100% 99%    Physical Exam Constitutional:      Appearance: She is well-developed.  HENT:     Head: Atraumatic.  Eyes:     Conjunctiva/sclera: Conjunctivae normal.  Cardiovascular:     Rate and Rhythm: Regular rhythm.  Pulmonary:      Effort: No respiratory distress.  Abdominal:     General: There is no distension.     Comments: Gravid uterus, nontender to palpation.  Well-healing abdominal scars.  Musculoskeletal:        General: Normal range of motion.     Cervical back: Normal range of motion.  Skin:    General: Skin is warm.  Neurological:     Mental Status: She is alert. Mental status is at baseline.     Comments: Cranial nerves grossly intact.  5/5 strength bilateral upper and lower extremities.  Sensation intact in upper and lower extremities.  Normal gait.     IMPRESSION / MDM / ASSESSMENT AND PLAN / ED COURSE  I reviewed the triage vital signs and the nursing notes.  Differential diagnosis including subarachnoid hemorrhage, primary headache, electrolyte abnormality, dehydration.  Considered pulmonary embolism however no shortness of breath or chest pain, no tachycardia or hypoxia.  Shared decision making and discussed the risk of CT scan of the head and radiation to  the fetus.  Discussed low risk radiation and patient ultimately decided that she wanted to proceed with CT scan of the head.  EKG  I, Nathaniel Man, the attending physician, personally viewed and interpreted this ECG.   Rate: Normal  Rhythm: Normal sinus  Axis: Normal  Intervals: Normal  ST&T Change: None  No tachycardic or bradycardic dysrhythmias while on cardiac telemetry.  RADIOLOGY I independently reviewed imaging, my interpretation of imaging: CT scan of the head without signs of intracranial hemorrhage.  Read as no acute findings.  LABS (all labs ordered are listed, but only abnormal results are displayed) Labs interpreted as -  Mildly low CO2.  Anemia that is likely secondary to pregnancy.  Does not meet criteria for blood transfusion.  No significant electrolyte abnormalities.  No symptoms or signs of a urinary tract infection.  Labs Reviewed  BASIC METABOLIC PANEL - Abnormal; Notable for the following components:       Result Value   Sodium 134 (*)    CO2 21 (*)    Calcium 8.0 (*)    Anion gap 3 (*)    All other components within normal limits  CBC WITH DIFFERENTIAL/PLATELET - Abnormal; Notable for the following components:   Hemoglobin 11.7 (*)    All other components within normal limits  URINALYSIS, ROUTINE W REFLEX MICROSCOPIC - Abnormal; Notable for the following components:   Color, Urine COLORLESS (*)    APPearance CLEAR (*)    Specific Gravity, Urine 1.001 (*)    All other components within normal limits  MAGNESIUM  TYPE AND SCREEN    TREATMENT   On reevaluation patient had significant improvement of her headache and is now back to her normal.  No ongoing headache or dizziness and is back to her baseline.  Clinical picture is not consistent with a cerebral venous thrombosis.  Clinical picture is not consistent with meningitis.  Do not feel that CTA is necessary at this time.  No signs or concerns of a subarachnoid hemorrhage given CT scan done within 6 hours of headache onset.  Bedside ultrasound with good fetal heart rate and good fetal movement.  Fetal heart rate of 152.  Patient given information to follow-up with obstetrician.  Given return precautions.  Ambulating in the emergency department without any difficulties.    PROCEDURES:  Critical Care performed: No  Procedures  Patient's presentation is most consistent with acute presentation with potential threat to life or bodily function.   MEDICATIONS ORDERED IN ED: Medications  0.9 %  sodium chloride infusion ( Intravenous New Bag/Given 05/27/22 1000)    FINAL CLINICAL IMPRESSION(S) / ED DIAGNOSES   Final diagnoses:  Dizziness  Near syncope     Rx / DC Orders   ED Discharge Orders     None        Note:  This document was prepared using Dragon voice recognition software and may include unintentional dictation errors.   Nathaniel Man, MD 05/27/22 1041    Nathaniel Man, MD 05/27/22 [phone]

## 2022-05-27 NOTE — ED Notes (Signed)
Provider at bedside with interpreter. CN notified. OB paged.

## 2022-05-27 NOTE — ED Notes (Signed)
23rd of May is due date. Pt has two living children and one miscarriage

## 2022-05-28 ENCOUNTER — Ambulatory Visit: Payer: Medicaid Other | Admitting: Advanced Practice Midwife

## 2022-05-28 VITALS — BP 112/66 | HR 89 | Temp 97.1°F | Wt 168.0 lb

## 2022-05-28 DIAGNOSIS — O09529 Supervision of elderly multigravida, unspecified trimester: Secondary | ICD-10-CM

## 2022-05-28 DIAGNOSIS — H16051 Mooren's corneal ulcer, right eye: Secondary | ICD-10-CM

## 2022-05-28 DIAGNOSIS — O99210 Obesity complicating pregnancy, unspecified trimester: Secondary | ICD-10-CM

## 2022-05-28 DIAGNOSIS — Z3402 Encounter for supervision of normal first pregnancy, second trimester: Secondary | ICD-10-CM

## 2022-05-28 DIAGNOSIS — Z98891 History of uterine scar from previous surgery: Secondary | ICD-10-CM

## 2022-05-28 DIAGNOSIS — Z3400 Encounter for supervision of normal first pregnancy, unspecified trimester: Secondary | ICD-10-CM

## 2022-05-28 DIAGNOSIS — O09522 Supervision of elderly multigravida, second trimester: Secondary | ICD-10-CM

## 2022-05-28 DIAGNOSIS — O99212 Obesity complicating pregnancy, second trimester: Secondary | ICD-10-CM

## 2022-05-28 NOTE — Progress Notes (Signed)
[place] [facility]  PRENATAL VISIT NOTE  Subjective:  Lori Richards is a 37 y.o. [phone] at 65w6dbeing seen today for ongoing prenatal care.  She is currently monitored for the following issues for this low-risk pregnancy and has Mooren's corneal ulcer, right eye; Supervision of normal first pregnancy, antepartum; Antepartum multigravida of advanced maternal age 37 Obesity affecting pregnancy, antepartum; H/O cesarean section x2 classical incisions; Uterine fibroid 3.4x3.3x3.2 cm on 03/18/22 u/s; and left benign ovarian cyst 2.7x2.6x2.6 cm on 02/06/22 u/s on their problem list.  Patient reports  pain below umbilicus where c/s scars are, nosebleeds .  Contractions: Not present. Vag. Bleeding: None.  Movement: Present. Denies leaking of fluid/ROM.   The following portions of the patient's history were reviewed and updated as appropriate: allergies, current medications, past family history, past medical history, past social history, past surgical history and problem list. Problem list updated.  Objective:   Vitals:   05/28/22 1308  BP: 112/66  Pulse: 89  Temp: (!) 97.1 F (36.2 C)  Weight: 168 lb (76.2 kg)    Fetal Status: Fetal Heart Rate (bpm): 155 Fundal Height: 27 cm Movement: Present     General:  Alert, oriented and cooperative. Patient is in no acute distress.  Skin: Skin is warm and dry. No rash noted.   Cardiovascular: Normal heart rate noted  Respiratory: Normal respiratory effort, no problems with respiration noted  Abdomen: Soft, gravid, appropriate for gestational age.  Pain/Pressure: Absent     Pelvic: Cervical exam deferred        Extremities: Normal range of motion.  Edema: None  Mental Status: Normal mood and affect. Normal behavior. Normal judgment and thought content.   Assessment and Plan:  Pregnancy: [id number] 251w6d1. Supervision of normal first pregnancy, antepartum Working 40 hrs/wk Went to ER 05/27/22 with  dizziness and nosebleed; suggestions given C/o discomfort immediately below umbilicus at c/s scars onset this week; probable stretching of scar tissue due to 2 c/s; suggestions given C/o feeling like she has to take deep breaths often last week x1 and yesterday; suggestions given; pt breathing easily in NAD [facility]; Ev[place] Bambergnable to obtain transportation this late with Araceli, RN so apt rescheduled Has growth u/s 06/03/22 but has no transportation; EvEstill Bambergo obtain transportation for pt Reviewed 04/29/22 u/s at 20 4/7 with EFW=91%, posterior placenta, AFI wnl  2. Mooren's corneal ulcer, right eye Picked up rx and is using daily with relief   3. Obesity affecting pregnancy, antepartum, unspecified obesity type 2 lb 10.5 oz (1.204 kg) Goes to gym and uses stationary bike x 20 min Taking ASA daily Needs growth u/s q 4 wks beginning 28 wks  4. H/O cesarean section x2 classical incisions Will need delivery plans 32-34 wks for repeat c/s 39 wks  5. Antepartum multigravida of advanced maternal age [id number] genetic counseling apt 03/18/22 NIPS neg 03/18/22 AFP only neg 05/02/22    Preterm labor symptoms and general obstetric precautions including but not limited to vaginal bleeding, contractions, leaking of fluid and fetal movement were reviewed in detail with the patient. Please refer to After Visit Summary for other counseling recommendations.  Return in about 4 weeks (around 06/25/2022) for routine PNC, 28 week labs.  Future Appointments  Date Time Provider [place]2/09/2022 10:00 AM [facility] US1 [facility] [place]CN[place]

## 2022-05-28 NOTE — Progress Notes (Signed)
Patient here at [redacted]w[redacted]d   Informed patient of preterm labor handout with help of interpreter.   Spoke with Araceli, RN and she is [place] at 1000am arrival time.   [facility] however there is not enough time to set up transportation.   Maternity RN will reschedule dental app and notify Araceli so she can set up appointment.   Dental appointment rescheduled for 06/05/22 for 2pm. Araceli, RN made aware.   EAl Decant RN

## 2022-05-30 ENCOUNTER — Telehealth: Payer: Self-pay

## 2022-05-30 NOTE — Telephone Encounter (Signed)
06/05/22 - Dental appt with [place] 2 pm ,transportation has been confirmed. ref trip number: [id number]

## 2022-06-02 ENCOUNTER — Other Ambulatory Visit: Payer: Self-pay

## 2022-06-02 DIAGNOSIS — O99210 Obesity complicating pregnancy, unspecified trimester: Secondary | ICD-10-CM

## 2022-06-02 DIAGNOSIS — Z98891 History of uterine scar from previous surgery: Secondary | ICD-10-CM

## 2022-06-02 DIAGNOSIS — O09521 Supervision of elderly multigravida, first trimester: Secondary | ICD-10-CM

## 2022-06-03 ENCOUNTER — Ambulatory Visit: Payer: Medicaid Other | Attending: Maternal & Fetal Medicine

## 2022-06-03 ENCOUNTER — Other Ambulatory Visit: Payer: Self-pay

## 2022-06-03 VITALS — BP 113/74 | HR 96 | Temp 98.0°F | Ht 60.0 in | Wt 167.5 lb

## 2022-06-03 DIAGNOSIS — O402XX1 Polyhydramnios, second trimester, fetus 1: Secondary | ICD-10-CM | POA: Diagnosis not present

## 2022-06-03 DIAGNOSIS — E669 Obesity, unspecified: Secondary | ICD-10-CM

## 2022-06-03 DIAGNOSIS — O09529 Supervision of elderly multigravida, unspecified trimester: Secondary | ICD-10-CM

## 2022-06-03 DIAGNOSIS — O402XX0 Polyhydramnios, second trimester, not applicable or unspecified: Secondary | ICD-10-CM

## 2022-06-03 DIAGNOSIS — O99212 Obesity complicating pregnancy, second trimester: Secondary | ICD-10-CM | POA: Diagnosis not present

## 2022-06-03 DIAGNOSIS — Z98891 History of uterine scar from previous surgery: Secondary | ICD-10-CM | POA: Insufficient documentation

## 2022-06-03 DIAGNOSIS — Z3A24 24 weeks gestation of pregnancy: Secondary | ICD-10-CM

## 2022-06-03 DIAGNOSIS — O09521 Supervision of elderly multigravida, first trimester: Secondary | ICD-10-CM

## 2022-06-03 DIAGNOSIS — O09522 Supervision of elderly multigravida, second trimester: Secondary | ICD-10-CM | POA: Diagnosis not present

## 2022-06-03 DIAGNOSIS — O34219 Maternal care for unspecified type scar from previous cesarean delivery: Secondary | ICD-10-CM | POA: Diagnosis not present

## 2022-06-03 DIAGNOSIS — O99210 Obesity complicating pregnancy, unspecified trimester: Secondary | ICD-10-CM

## 2022-06-05 ENCOUNTER — Encounter: Payer: Self-pay | Admitting: Family Medicine

## 2022-06-05 DIAGNOSIS — O409XX0 Polyhydramnios, unspecified trimester, not applicable or unspecified: Secondary | ICD-10-CM | POA: Insufficient documentation

## 2022-06-21 ENCOUNTER — Encounter: Payer: Self-pay | Admitting: Certified Nurse Midwife

## 2022-06-21 ENCOUNTER — Observation Stay
Admission: EM | Admit: 2022-06-21 | Discharge: 2022-06-21 | Disposition: A | Discharge status: Home / Self Care | Payer: Medicaid Other | Attending: Certified Nurse Midwife | Admitting: Certified Nurse Midwife

## 2022-06-21 DIAGNOSIS — Z3A27 27 weeks gestation of pregnancy: Secondary | ICD-10-CM | POA: Diagnosis not present

## 2022-06-21 DIAGNOSIS — O09529 Supervision of elderly multigravida, unspecified trimester: Secondary | ICD-10-CM

## 2022-06-21 DIAGNOSIS — O23592 Infection of other part of genital tract in pregnancy, second trimester: Principal | ICD-10-CM | POA: Insufficient documentation

## 2022-06-21 DIAGNOSIS — Z79899 Other long term (current) drug therapy: Secondary | ICD-10-CM | POA: Insufficient documentation

## 2022-06-21 DIAGNOSIS — O09522 Supervision of elderly multigravida, second trimester: Secondary | ICD-10-CM | POA: Insufficient documentation

## 2022-06-21 DIAGNOSIS — B379 Candidiasis, unspecified: Secondary | ICD-10-CM | POA: Diagnosis not present

## 2022-06-21 DIAGNOSIS — Z7982 Long term (current) use of aspirin: Secondary | ICD-10-CM | POA: Diagnosis not present

## 2022-06-21 DIAGNOSIS — B3731 Acute candidiasis of vulva and vagina: Secondary | ICD-10-CM | POA: Diagnosis not present

## 2022-06-21 DIAGNOSIS — O98812 Other maternal infectious and parasitic diseases complicating pregnancy, second trimester: Secondary | ICD-10-CM | POA: Diagnosis not present

## 2022-06-21 DIAGNOSIS — O409XX0 Polyhydramnios, unspecified trimester, not applicable or unspecified: Secondary | ICD-10-CM

## 2022-06-21 DIAGNOSIS — O99210 Obesity complicating pregnancy, unspecified trimester: Secondary | ICD-10-CM

## 2022-06-21 LAB — URINALYSIS, ROUTINE W REFLEX MICROSCOPIC
Bilirubin Urine: NEGATIVE
Glucose, UA: NEGATIVE mg/dL
Hgb urine dipstick: NEGATIVE
Ketones, ur: NEGATIVE mg/dL
Nitrite: NEGATIVE
Protein, ur: NEGATIVE mg/dL
Specific Gravity, Urine: 1.011 (ref 1.005–1.030)
pH: 6 (ref 5.0–8.0)

## 2022-06-21 LAB — RUPTURE OF MEMBRANE (ROM)PLUS: Rom Plus: NEGATIVE

## 2022-06-21 LAB — WET PREP, GENITAL
Clue Cells Wet Prep HPF POC: NONE SEEN
Sperm: NONE SEEN
Trich, Wet Prep: NONE SEEN
WBC, Wet Prep HPF POC: >=10 — AB (ref <10)

## 2022-06-21 MED ORDER — FLUCONAZOLE 50 MG PO TABS
150.0000 mg | ORAL_TABLET | Freq: Once | ORAL | Status: AC
  Administered 2022-06-21: 150 mg via ORAL
  Filled 2022-06-21: qty 1

## 2022-06-21 MED ORDER — TERBUTALINE SULFATE 1 MG/ML IJ SOLN
INTRAMUSCULAR | Status: AC
  Administered 2022-06-21: 0.25 mg via SUBCUTANEOUS
  Filled 2022-06-21: qty 1

## 2022-06-21 MED ORDER — TERBUTALINE SULFATE 1 MG/ML IJ SOLN: 0.2500 mg | Freq: Once | INTRAMUSCULAR | Status: AC

## 2022-06-21 MED ORDER — ACETAMINOPHEN 500 MG PO TABS
1000.0000 mg | ORAL_TABLET | Freq: Once | ORAL | Status: AC | PRN
  Administered 2022-06-21: 1000 mg via ORAL
  Filled 2022-06-21: qty 2

## 2022-06-21 NOTE — OB Triage Note (Signed)
   L&D OB Triage Note  SUBJECTIVE Lori Richards is a 37 y.o. [id number] female at [redacted]w[redacted]d EDD Estimated Date of Delivery: 09/18/22 who presented to triage with complaints of discharge , vaginal itching , back pain radiates around to the front.   OB History  Gravida Para Term Preterm AB Living  4 2 2 $ 0 1 2  SAB IAB Ectopic Multiple Live Births  1 0 0 0 2    # Outcome Date GA Lbr Len/2nd Weight Sex Delivery Anes PTL Lv  4 Current           3 Term 06/02/19    F CS-Unspec   LIV  2 Term 04/09/18    M CS-Unspec   LIV  1 SAB 2016            Medications Prior to Admission  Medication Sig Dispense Refill Last Dose   acetaminophen (TYLENOL) 500 MG tablet Take 500 mg by mouth every 6 (six) hours as needed for moderate pain.   06/20/2022   aspirin EC 81 MG tablet Take 1 tablet (81 mg total) by mouth daily. Swallow whole.   06/20/2022   prednisoLONE acetate (PRED FORTE) 1 % ophthalmic suspension Place 1 drop into the right eye 2 (two) times daily.   06/21/2022   Prenatal Vit-Fe Fumarate-FA (PRENATAL VITAMIN) 27-0.8 MG TABS Take 1 tablet by mouth daily. 100 tablet 0 06/20/2022     OBJECTIVE  Nursing Evaluation:   BP 118/72 (BP Location: Right Arm)   Pulse 64   Temp 98.9 F (37.2 C) (Oral)   Resp 16   LMP 12/12/2021 (Exact Date)    Findings:   wet prep positive for yeast Possible UTI, culture pending.       NST was performed and has been reviewed by me.  NST INTERPRETATION: Category I  Mode: External Baseline FHT 155 Moderate variability 10 x10 accels present No declarations  Irregular contractions that decreased with PO hydration and one dose of terbutaline.  ASSESSMENT Impression:  1.  Pregnancy:  [id number] 275w2d EDD Estimated Date of Delivery: 09/18/22 2.  Reassuring fetal and maternal status 3.  Yeast infection treated with diflucan in triage 4. Possible UTI , small leukocytes on UA , culture pending 5. Round ligament pain /back pian in pregnancy 6.  ROM plus  negative  PLAN 1. Current condition and above findings reviewed.  Reassuring fetal and maternal condition. 2. Discharge home with standard labor precautions given to return to L&D or call the office for problems. 3. Continue routine prenatal care at the health department as scheduled.   I was present and evaluated this pt in person.   AnPhilip AspenCNM

## 2022-06-21 NOTE — Discharge Instructions (Signed)
Please keep your next scheduled appointment.  If you have questions or concerns you may call your on call provider.  If you have urgent concerns please go to the nearest emergency department for evaluation.

## 2022-06-21 NOTE — OB Triage Note (Addendum)
Patient triaged with Swahili interpreter on stick.  Patient comes to hospital with complaints of discharge that started as clear 3 days ago and became thicker until today. She does report increased itching.  Swabs collected and discharge is whitish to yellow in color.  Patient also complains of pain in the back and front around the waist level and down to the pubic area.  She reports this pain as 5/10, spasm in description,  comes and goes, lasting 1-2 minutes.  She reports baby moving well. Father of baby at the bedside.  Consents signed and plan for care reviewed with patient. Rom plus negative.  Wet prep positive for yeast.  Diflucan started.  Prescriptions sent to patient's pharmacy.

## 2022-06-22 LAB — URINE CULTURE
Culture: NO GROWTH
Special Requests: NORMAL

## 2022-06-23 ENCOUNTER — Encounter: Payer: Self-pay | Admitting: Advanced Practice Midwife

## 2022-06-23 DIAGNOSIS — O09529 Supervision of elderly multigravida, unspecified trimester: Secondary | ICD-10-CM

## 2022-06-24 ENCOUNTER — Telehealth: Payer: Self-pay

## 2022-06-24 NOTE — Telephone Encounter (Signed)
Transportation set up for client for 07/08/22, for [place] appt, Trip ref # [id number], Pick up from residence : 10:20am, Drop off 10:50am. Any issues with transportation call [place]: [phone].

## 2022-06-26 ENCOUNTER — Ambulatory Visit: Payer: Medicaid Other | Admitting: Physician Assistant

## 2022-06-26 ENCOUNTER — Encounter: Payer: Self-pay | Admitting: Physician Assistant

## 2022-06-26 VITALS — BP 97/65 | HR 84 | Temp 97.4°F | Wt 170.0 lb

## 2022-06-26 DIAGNOSIS — Z23 Encounter for immunization: Secondary | ICD-10-CM

## 2022-06-26 DIAGNOSIS — O09529 Supervision of elderly multigravida, unspecified trimester: Secondary | ICD-10-CM

## 2022-06-26 DIAGNOSIS — O09899 Supervision of other high risk pregnancies, unspecified trimester: Secondary | ICD-10-CM

## 2022-06-26 DIAGNOSIS — O409XX0 Polyhydramnios, unspecified trimester, not applicable or unspecified: Secondary | ICD-10-CM

## 2022-06-26 DIAGNOSIS — Z3400 Encounter for supervision of normal first pregnancy, unspecified trimester: Secondary | ICD-10-CM

## 2022-06-26 DIAGNOSIS — O99210 Obesity complicating pregnancy, unspecified trimester: Secondary | ICD-10-CM

## 2022-06-26 DIAGNOSIS — O09523 Supervision of elderly multigravida, third trimester: Secondary | ICD-10-CM

## 2022-06-26 DIAGNOSIS — O99213 Obesity complicating pregnancy, third trimester: Secondary | ICD-10-CM

## 2022-06-26 LAB — HEMOGLOBIN, FINGERSTICK: Hemoglobin: 11.1 g/dL (ref 11.1–15.9)

## 2022-06-26 NOTE — Progress Notes (Signed)
Patient here for [place] RV at [redacted]w[redacted]d   Hgb (11.1) reviewed - no treatment indicated.   28 week labs completed today. Tdap administered and tolerated well. VIS in Swahili handed to patient and updated NCIR.  Patient states her dental appt has been rescheduled for March 7th and waiting on transportation.  She is aware of [facility] on 07/07/22 @ 11am and states she does have transportation confirmation.   Patient states she is having trouble with WIC. [facility] AEstill Bambergwho states she just added her renewal of benefits today and she needs to make it [facility] to keep WHarrisup to date. [facility]. Appointment scheduled for 09/17/2022 at 10:30.    EAl Decant RN

## 2022-06-26 NOTE — Progress Notes (Signed)
[place] [facility]  PRENATAL VISIT NOTE  Subjective:  Lori Richards is a 37 y.o. [phone] at 70w0dbeing seen today for ongoing prenatal care.  She is currently monitored for the following issues for this high-risk pregnancy and has Mooren's corneal ulcer, right eye; Supervision of other high risk pregnancy, antepartum; Antepartum multigravida of advanced maternal age 37 Obesity affecting pregnancy, antepartum; H/O cesarean section x2 classical incisions; Uterine fibroid 3.4x3.3x3.2 cm on 03/18/22 u/s; left benign ovarian cyst 2.7x2.6x2.6 cm on 02/06/22 u/s; Polyhydramnios affecting pregnancy 06/03/22; and Labor and delivery, indication for care on their problem list.  Patient reports  pelvic pressure with prolonged standing .  Contractions: Not present. Vag. Bleeding: None.  Movement: Present. Denies leaking of fluid/ROM.   The following portions of the patient's history were reviewed and updated as appropriate: allergies, current medications, past family history, past medical history, past social history, past surgical history and problem list. Problem list updated.  Objective:   Vitals:   06/26/22 1307  BP: 97/65  Pulse: 84  Temp: (!) 97.4 F (36.3 C)  Weight: 170 lb (77.1 kg)    Fetal Status: Fetal Heart Rate (bpm): 152 Fundal Height: 32 cm Movement: Present     General:  Alert, oriented and cooperative. Patient is in no acute distress.  Skin: Skin is warm and dry. No rash noted.   Cardiovascular: Normal heart rate noted  Respiratory: Normal respiratory effort, no problems with respiration noted  Abdomen: Soft, gravid, appropriate for gestational age.  Pain/Pressure: Absent     Pelvic: Cervical exam deferred        Extremities: Normal range of motion.  Edema: None  Mental Status: Normal mood and affect. Normal behavior. Normal judgment and thought content.   Assessment and Plan:  Pregnancy: [id number] 282w0d1. Supervision of  normal first pregnancy, antepartum Routine labs today. Extensive discussion via Swahili interpreters re: short term disability (employer has not identified work that aligns with normal pregnancy restrictions for work at relevant gestational ages and initiated medical leave as of 06/18/22) and anticipated minimum 6-wk total disability (supported with FMLA) for recovery from childbirth and care of infant following anticipated term pregnancy/repeat Cesarean section. No clear medical indication to be out of work beyond [employer] restrictions as documented in my 03/06/22 letter. [employer] and [employer]arorms completed. Called employer HR representative TaAniceto Bosserbin to clarify if other work might be available until delivery - left message on voicemail for callback. - Hemoglobin, fingerstick - Glucose, 1 hour gestational - HIV-1/HIV-2 Qualitative RNA - RPR - Tdap vaccine greater than or equal to 7yo IM  2. Obesity affecting pregnancy, antepartum, unspecified obesity type 4lb TWG, continues on low-dose daily aspirin.  3. Polyhydramnios affecting pregnancy 06/03/22 Enc to keep next [place] sched 07/08/22, plan to continue every 4 weeks.  4. Antepartum multigravida of advanced maternal age 8944ontinue daily low-dose aspirin.  5. Supervision of other high risk pregnancy, antepartum  Preterm labor symptoms and general obstetric precautions including but not limited to vaginal bleeding, contractions, leaking of fluid and fetal movement were reviewed in detail with the patient. Due to language barrier, an interpreter ([phone] present via phone during the history-taking and subsequent discussion (and the physical exam) with this patient.  Please refer to After Visit Summary for other counseling recommendations.  Return in about 2 weeks (around 07/10/2022) for Routine prenatal care.  Future Appointments  Date Time Provider [place]3/02/2023 11:00 AM  [facility]  US1 [facility]  07/10/2022  1:20 PM AC-MH PROVIDER AC-MAT None  07/24/2022  1:40 PM AC-MH PROVIDER AC-MAT None  07/28/2022 11:00 AM [facility]  08/25/2022 11:00 AM [facility] US[facility]    Timotheus Salm, PA-C

## 2022-06-26 NOTE — Progress Notes (Signed)
[employer] short term disability (STD) and [employer] FMLA forms brought in by patient today and forms filled out by provider A. Streilein PA-C today.   Forms faxed with okay confirmation.   Copies made and given to patient. ROI forms signed.   Al Decant, RN

## 2022-06-28 LAB — RPR: RPR Ser Ql: NONREACTIVE

## 2022-06-28 LAB — GLUCOSE, 1 HOUR GESTATIONAL: Gestational Diabetes Screen: 133 mg/dL (ref 70–139)

## 2022-06-28 LAB — HIV-1/HIV-2 QUALITATIVE RNA
HIV-1 RNA, Qualitative: NONREACTIVE
HIV-2 RNA, Qualitative: NONREACTIVE

## 2022-07-02 ENCOUNTER — Telehealth: Payer: Self-pay

## 2022-07-02 NOTE — Telephone Encounter (Signed)
Client not approved for food stamps. Letter stating "your application was not approved because your net income resulted in Food and Nutrition services benefits of less than $1.00" She states she has stopped working and needs a letter from her provider when they took her out of work, why and when she can return to work. So she can reapply for food stamps.Client informed she needs to get a letter from her employer of her last day of employment. Per the encounter notes I saw that [employer] and [facility] STD were faxed 2/29. Client informed she needs to update her nurse and provider at the time of her next maternity visit on 07/09/2021 at 1pm

## 2022-07-04 ENCOUNTER — Other Ambulatory Visit: Payer: Self-pay

## 2022-07-04 DIAGNOSIS — O409XX0 Polyhydramnios, unspecified trimester, not applicable or unspecified: Secondary | ICD-10-CM

## 2022-07-04 DIAGNOSIS — O09521 Supervision of elderly multigravida, first trimester: Secondary | ICD-10-CM

## 2022-07-04 DIAGNOSIS — Z98891 History of uterine scar from previous surgery: Secondary | ICD-10-CM

## 2022-07-04 DIAGNOSIS — O99210 Obesity complicating pregnancy, unspecified trimester: Secondary | ICD-10-CM

## 2022-07-07 ENCOUNTER — Ambulatory Visit: Payer: Medicaid Other | Attending: Obstetrics and Gynecology

## 2022-07-07 ENCOUNTER — Telehealth: Payer: Self-pay

## 2022-07-07 ENCOUNTER — Other Ambulatory Visit: Payer: Self-pay

## 2022-07-07 VITALS — BP 111/72 | HR 81 | Temp 98.5°F | Wt 172.0 lb

## 2022-07-07 DIAGNOSIS — Z3A29 29 weeks gestation of pregnancy: Secondary | ICD-10-CM | POA: Insufficient documentation

## 2022-07-07 DIAGNOSIS — O09899 Supervision of other high risk pregnancies, unspecified trimester: Secondary | ICD-10-CM

## 2022-07-07 DIAGNOSIS — E669 Obesity, unspecified: Secondary | ICD-10-CM | POA: Insufficient documentation

## 2022-07-07 DIAGNOSIS — O99213 Obesity complicating pregnancy, third trimester: Secondary | ICD-10-CM | POA: Diagnosis not present

## 2022-07-07 DIAGNOSIS — O09529 Supervision of elderly multigravida, unspecified trimester: Secondary | ICD-10-CM

## 2022-07-07 DIAGNOSIS — O34219 Maternal care for unspecified type scar from previous cesarean delivery: Secondary | ICD-10-CM | POA: Diagnosis not present

## 2022-07-07 DIAGNOSIS — O99210 Obesity complicating pregnancy, unspecified trimester: Secondary | ICD-10-CM

## 2022-07-07 DIAGNOSIS — O403XX0 Polyhydramnios, third trimester, not applicable or unspecified: Secondary | ICD-10-CM | POA: Insufficient documentation

## 2022-07-07 DIAGNOSIS — O409XX0 Polyhydramnios, unspecified trimester, not applicable or unspecified: Secondary | ICD-10-CM

## 2022-07-07 DIAGNOSIS — O09521 Supervision of elderly multigravida, first trimester: Secondary | ICD-10-CM

## 2022-07-07 DIAGNOSIS — Z98891 History of uterine scar from previous surgery: Secondary | ICD-10-CM

## 2022-07-07 DIAGNOSIS — O09523 Supervision of elderly multigravida, third trimester: Secondary | ICD-10-CM | POA: Insufficient documentation

## 2022-07-07 NOTE — Telephone Encounter (Signed)
Call received from [place] at [facility] with questions regarding scheduling transportation for client. R. Karrie Doffing MSW, OBCM contact numbers provided in addition to [place] number for transportation from pink sticky note. Shortly afterwards received another call from [facility] stating US transportation now worked out. Rich Number, RN

## 2022-07-08 ENCOUNTER — Ambulatory Visit: Payer: Medicaid Other

## 2022-07-10 ENCOUNTER — Encounter: Payer: Self-pay | Admitting: Physician Assistant

## 2022-07-10 ENCOUNTER — Ambulatory Visit: Payer: Medicaid Other | Admitting: Physician Assistant

## 2022-07-10 VITALS — BP 117/70 | HR 86 | Temp 97.3°F | Wt 171.2 lb

## 2022-07-10 DIAGNOSIS — O99213 Obesity complicating pregnancy, third trimester: Secondary | ICD-10-CM

## 2022-07-10 DIAGNOSIS — Z98891 History of uterine scar from previous surgery: Secondary | ICD-10-CM

## 2022-07-10 DIAGNOSIS — O409XX0 Polyhydramnios, unspecified trimester, not applicable or unspecified: Secondary | ICD-10-CM

## 2022-07-10 DIAGNOSIS — O09893 Supervision of other high risk pregnancies, third trimester: Secondary | ICD-10-CM

## 2022-07-10 DIAGNOSIS — O99210 Obesity complicating pregnancy, unspecified trimester: Secondary | ICD-10-CM

## 2022-07-10 DIAGNOSIS — O09899 Supervision of other high risk pregnancies, unspecified trimester: Secondary | ICD-10-CM

## 2022-07-10 NOTE — Progress Notes (Signed)
[place] [facility]  PRENATAL VISIT NOTE  Subjective:  Lori Richards is a 37 y.o. [phone] at 90w0dbeing seen today for ongoing prenatal care.  She is currently monitored for the following issues for this high-risk pregnancy and has Mooren's corneal ulcer, right eye; Supervision of other high risk pregnancy, antepartum; Antepartum multigravida of advanced maternal age 37 Obesity affecting pregnancy, antepartum; H/O cesarean section x2 classical incisions; Uterine fibroid 3.4x3.3x3.2 cm on 03/18/22 u/s; left benign ovarian cyst 2.7x2.6x2.6 cm on 02/06/22 u/s; Polyhydramnios affecting pregnancy 06/03/22; and Labor and delivery, indication for care on their problem list.  Patient reports fatigue and occasional contractions.  Contractions: Irritability. Vag. Bleeding: None.  Movement: Present. Overall, feels her pain is "reduced". Denies leaking of fluid/ROM.   The following portions of the patient's history were reviewed and updated as appropriate: allergies, current medications, past family history, past medical history, past social history, past surgical history and problem list. Problem list updated.  Objective:   Vitals:   07/10/22 1308  BP: 117/70  Pulse: 86  Temp: (!) 97.3 F (36.3 C)  Weight: 171 lb 3.2 oz (77.7 kg)    Fetal Status: Fetal Heart Rate (bpm): 147 Fundal Height: 33 cm Movement: Present     General:  Alert, oriented and cooperative. Patient is in no acute distress.  Skin: Skin is warm and dry. No rash noted.   Cardiovascular: Normal heart rate noted  Respiratory: Normal respiratory effort, no problems with respiration noted  Abdomen: Soft, gravid, appropriate for gestational age.  Pain/Pressure: Absent     Pelvic: Cervical exam deferred        Extremities: Normal range of motion.  Edema: None  Mental Status: Normal mood and affect. Normal behavior. Normal judgment and thought content.   Assessment and Plan:  Pregnancy:  [id number] 376w0d1. Supervision of other high risk pregnancy, antepartum Pt is still not working and requests note supporting her work absence so she can qualify for food stamps and work disability benefits. I reiterated that there is no current medical indication for complete disability and that there are many work-related tasks she should be able to do. TC to TaFortune Brandsemployer HR office to discuss work opportunities/restrictions - left message for her to return my call. Completed brief document from [employer] supporting disability beginning 06/18/22. Referred to OBCM due to food insecurity (met in person with R.ToKarrie Doffingoday).  2. Polyhydramnios affecting pregnancy 06/03/22 Mild polydramnios per 07/07/22 [place] to keep 08/04/22 [place] sched.  3. Obesity affecting pregnancy, antepartum, unspecified obesity type 5 5lb TWG. Continue aspirin.  4. H/O cesarean section x2 classical incisions Refer to [facility] for delivery plans appt at 32 wks, anticipate repeat C/S at 39 wk due to classical incision x 2.  Preterm labor symptoms and general obstetric precautions including but not limited to vaginal bleeding, contractions, leaking of fluid and fetal movement were reviewed in detail with the patient. Due to language barrier, an interpreter ([phone] present via phone during the history-taking and subsequent discussion (and the physical exam) with this patient.  Please refer to After Visit Summary for other counseling recommendations.  Return in about 2 weeks (around 07/24/2022) for Routine prenatal care.  Future Appointments  Date Time Provider [place]3/28/2024  1:40 PM AC-MH PROVIDER AC-MAT None  08/04/2022 11:00 AM [facility] US1 [facility]    AnLora HavensPA-C

## 2022-07-10 NOTE — Progress Notes (Signed)
Called [facility] to change appt scheduled on 07/28/22 to 08/04/22, ref [id number], time to be picked up between 10:05-10:35, pt aware.   [employer] paperwork faxed with fax confirmation received.   Pt is in need of food and financial assistance, secure message sent to [employer].  Harland Dingwall, RN

## 2022-07-10 NOTE — Progress Notes (Signed)
Call to [place] to ascertain if able to schedule delivery plans appt while client in the office. Per Clarise Cruz, no. Per Clarise Cruz, Dr. Marcelline Mates is reviewing all delivery plans referrals to determine if needs to be seen in the office. Delivery plans referral and snapshot pages faxed with fax confirmation received. Rich Number, RN

## 2022-07-24 ENCOUNTER — Encounter: Payer: Self-pay | Admitting: Family Medicine

## 2022-07-24 ENCOUNTER — Ambulatory Visit: Payer: Medicaid Other | Admitting: Family Medicine

## 2022-07-24 VITALS — BP 106/74 | HR 93 | Temp 97.1°F | Wt 172.4 lb

## 2022-07-24 DIAGNOSIS — O99213 Obesity complicating pregnancy, third trimester: Secondary | ICD-10-CM

## 2022-07-24 DIAGNOSIS — Z98891 History of uterine scar from previous surgery: Secondary | ICD-10-CM

## 2022-07-24 DIAGNOSIS — O09893 Supervision of other high risk pregnancies, third trimester: Secondary | ICD-10-CM

## 2022-07-24 DIAGNOSIS — O99210 Obesity complicating pregnancy, unspecified trimester: Secondary | ICD-10-CM

## 2022-07-24 DIAGNOSIS — O09899 Supervision of other high risk pregnancies, unspecified trimester: Secondary | ICD-10-CM

## 2022-07-24 DIAGNOSIS — O409XX0 Polyhydramnios, unspecified trimester, not applicable or unspecified: Secondary | ICD-10-CM

## 2022-07-24 LAB — URINALYSIS
Bilirubin, UA: NEGATIVE
Glucose, UA: NEGATIVE
Ketones, UA: NEGATIVE
Leukocytes,UA: NEGATIVE
Nitrite, UA: NEGATIVE
Protein,UA: NEGATIVE
RBC, UA: NEGATIVE
Specific Gravity, UA: 1.02 (ref 1.005–1.030)
Urobilinogen, Ur: 0.2 mg/dL (ref 0.2–1.0)
pH, UA: 6 (ref 5.0–7.5)

## 2022-07-24 NOTE — Progress Notes (Signed)
Here for MH RV at 32 weeks. Kick counts reviewed and cards given. Patient aware of U/S 08/04/22 and states she has ride set up for this appointment. Also aware of [facility] delivery plans appointment on 08/15/22 and needs a ride set up for that appointment.Jenetta Downer, RN

## 2022-07-24 NOTE — Progress Notes (Addendum)
Patient requested help with paying a [facility] bill. TC to the number on her bill and was told that they can't take payment information over the phone. [name] came to see patient and apparently they worked it out. Urine dip reviewed by provider in clinic.Marland KitchenMarland KitchenMarland KitchenJenetta Downer, RN

## 2022-07-24 NOTE — Progress Notes (Signed)
[place] [facility]  PRENATAL VISIT NOTE  Subjective:  Lori Richards is a 37 y.o. [phone] at [redacted]w[redacted]d being seen today for ongoing prenatal care.  She is currently monitored for the following issues for this high-risk pregnancy and has Mooren's corneal ulcer, right eye; Supervision of other high risk pregnancy, antepartum; Antepartum multigravida of advanced maternal age 32; Obesity affecting pregnancy, antepartum; H/O cesarean section x2 classical incisions; Uterine fibroid 3.4x3.3x3.2 cm on 03/18/22 u/s; left benign ovarian cyst 2.7x2.6x2.6 cm on 02/06/22 u/s; and Polyhydramnios affecting pregnancy 06/03/22 on their problem list.  Patient reports backache.  Contractions: Not present. Vag. Bleeding: None.  Movement: Present. Denies leaking of fluid/ROM.   The following portions of the patient's history were reviewed and updated as appropriate: allergies, current medications, past family history, past medical history, past social history, past surgical history and problem list. Problem list updated.  Objective:   Vitals:   07/24/22 1435  BP: 106/74  Pulse: 93  Temp: (!) 97.1 F (36.2 C)  Weight: 172 lb 6.4 oz (78.2 kg)    Fetal Status: Fetal Heart Rate (bpm): 145 Fundal Height: 32 cm Movement: Present     General:  Alert, oriented and cooperative. Patient is in no acute distress.  Skin: Skin is warm and dry. No rash noted.   Cardiovascular: Normal heart rate noted  Respiratory: Normal respiratory effort, no problems with respiration noted  Abdomen: Soft, gravid, appropriate for gestational age.  Pain/Pressure: Absent     Pelvic: Cervical exam deferred        Extremities: Normal range of motion.  Edema: None  Mental Status: Normal mood and affect. Normal behavior. Normal judgment and thought content.   Assessment and Plan:  Pregnancy: [id number] at [redacted]w[redacted]d  1. Supervision of other high risk pregnancy, antepartum -32 weeks of pregnancy -no crib   -worries about how to prepare for this delivery- how to know what to bring with you to the hospital -reviewed needs a car seat to leave the hospital  -wants resources - spoke with case management today- given information on what to bring to the hospital - Urinalysis - Urine Culture  2. Obesity affecting pregnancy, antepartum, unspecified obesity type 7 lb 0.9 oz (3.2 kg) -asa taking daily  3. Polyhydramnios affecting pregnancy 06/03/22 -follow up ultrasound scheduled for 4/8/ 24  4. H/O cesarean section x2 classical incisions -delivery plans appt scheduled for 4/19    Preterm labor symptoms and general obstetric precautions including but not limited to vaginal bleeding, contractions, leaking of fluid and fetal movement were reviewed in detail with the patient. Please refer to After Visit Summary for other counseling recommendations.  Return in about 2 weeks (around 08/07/2022) for Routine Prenatal Care.  Future Appointments  Date Time Provider [place]  08/04/2022 11:00 AM [facility] US1 [facility]  08/07/2022  2:00 PM AC-MH PROVIDER AC-MAT None  08/15/2022 10:15 AM Harlin Heys, MD [facility]-A[facility] None  Due to language barrier, interpreter Raul Del [phone]) was present for this visit.   Sharlet Salina, [place]

## 2022-07-26 LAB — URINE CULTURE: Organism ID, Bacteria: NO GROWTH

## 2022-07-28 ENCOUNTER — Ambulatory Visit: Payer: Medicaid Other

## 2022-07-29 ENCOUNTER — Ambulatory Visit: Payer: Medicaid Other

## 2022-07-30 ENCOUNTER — Other Ambulatory Visit: Payer: Self-pay

## 2022-07-30 DIAGNOSIS — O09523 Supervision of elderly multigravida, third trimester: Secondary | ICD-10-CM

## 2022-07-30 DIAGNOSIS — Z98891 History of uterine scar from previous surgery: Secondary | ICD-10-CM

## 2022-07-30 DIAGNOSIS — O09521 Supervision of elderly multigravida, first trimester: Secondary | ICD-10-CM

## 2022-07-30 DIAGNOSIS — O409XX0 Polyhydramnios, unspecified trimester, not applicable or unspecified: Secondary | ICD-10-CM

## 2022-08-04 ENCOUNTER — Other Ambulatory Visit: Payer: Self-pay | Admitting: Obstetrics

## 2022-08-04 ENCOUNTER — Other Ambulatory Visit: Payer: Self-pay

## 2022-08-04 ENCOUNTER — Ambulatory Visit: Payer: Medicaid Other | Attending: Obstetrics

## 2022-08-04 VITALS — BP 109/77 | HR 98 | Temp 98.4°F | Wt 175.5 lb

## 2022-08-04 DIAGNOSIS — O99210 Obesity complicating pregnancy, unspecified trimester: Secondary | ICD-10-CM

## 2022-08-04 DIAGNOSIS — Z98891 History of uterine scar from previous surgery: Secondary | ICD-10-CM

## 2022-08-04 DIAGNOSIS — O409XX0 Polyhydramnios, unspecified trimester, not applicable or unspecified: Secondary | ICD-10-CM

## 2022-08-04 DIAGNOSIS — Z3A33 33 weeks gestation of pregnancy: Secondary | ICD-10-CM

## 2022-08-04 DIAGNOSIS — O403XX0 Polyhydramnios, third trimester, not applicable or unspecified: Secondary | ICD-10-CM | POA: Diagnosis not present

## 2022-08-04 DIAGNOSIS — O34219 Maternal care for unspecified type scar from previous cesarean delivery: Secondary | ICD-10-CM | POA: Diagnosis not present

## 2022-08-04 DIAGNOSIS — O09523 Supervision of elderly multigravida, third trimester: Secondary | ICD-10-CM

## 2022-08-04 DIAGNOSIS — O09529 Supervision of elderly multigravida, unspecified trimester: Secondary | ICD-10-CM

## 2022-08-04 DIAGNOSIS — O09899 Supervision of other high risk pregnancies, unspecified trimester: Secondary | ICD-10-CM

## 2022-08-07 ENCOUNTER — Ambulatory Visit: Payer: Medicaid Other | Admitting: Family Medicine

## 2022-08-07 VITALS — BP 111/73 | HR 101 | Temp 97.1°F | Wt 177.0 lb

## 2022-08-07 DIAGNOSIS — O99210 Obesity complicating pregnancy, unspecified trimester: Secondary | ICD-10-CM

## 2022-08-07 DIAGNOSIS — O09899 Supervision of other high risk pregnancies, unspecified trimester: Secondary | ICD-10-CM

## 2022-08-07 DIAGNOSIS — O09523 Supervision of elderly multigravida, third trimester: Secondary | ICD-10-CM

## 2022-08-07 DIAGNOSIS — O409XX0 Polyhydramnios, unspecified trimester, not applicable or unspecified: Secondary | ICD-10-CM

## 2022-08-07 DIAGNOSIS — O09529 Supervision of elderly multigravida, unspecified trimester: Secondary | ICD-10-CM

## 2022-08-07 DIAGNOSIS — Z98891 History of uterine scar from previous surgery: Secondary | ICD-10-CM

## 2022-08-07 DIAGNOSIS — O09893 Supervision of other high risk pregnancies, third trimester: Secondary | ICD-10-CM

## 2022-08-07 DIAGNOSIS — O99213 Obesity complicating pregnancy, third trimester: Secondary | ICD-10-CM

## 2022-08-07 LAB — URINALYSIS
Bilirubin, UA: NEGATIVE
Glucose, UA: NEGATIVE
Ketones, UA: NEGATIVE
Leukocytes,UA: NEGATIVE
Nitrite, UA: NEGATIVE
RBC, UA: NEGATIVE
Specific Gravity, UA: 1.025 (ref 1.005–1.030)
Urobilinogen, Ur: 1 mg/dL (ref 0.2–1.0)
pH, UA: 7 (ref 5.0–7.5)

## 2022-08-07 NOTE — Progress Notes (Signed)
[facility]  PRENATAL VISIT NOTE  Subjective:  Lori Richards is a 37 y.o. [phone] at [redacted]w[redacted]d being seen today for ongoing prenatal care.  She is currently monitored for the following issues for this high-risk pregnancy and has Mooren's corneal ulcer, right eye; Supervision of other high risk pregnancy, antepartum; Antepartum multigravida of advanced maternal age 36; Obesity affecting pregnancy, antepartum; H/O cesarean section x2 classical incisions; Uterine fibroid 3.4x3.3x3.2 cm on 03/18/22 u/s; left benign ovarian cyst 2.7x2.6x2.6 cm on 02/06/22 u/s; and Polyhydramnios affecting pregnancy- resolved 4/8 on their problem list.  Patient reports backache and occasional contractions.  Contractions: Irregular. Vag. Bleeding: None.  Movement: Present. Denies leaking of fluid/ROM.   The following portions of the patient's history were reviewed and updated as appropriate: allergies, current medications, past family history, past medical history, past social history, past surgical history and problem list. Problem list updated.  Objective:   Vitals:   08/07/22 1415  BP: 111/73  Pulse: (!) 101  Temp: (!) 97.1 F (36.2 C)  Weight: 177 lb (80.3 kg)    Fetal Status: Fetal Heart Rate (bpm): 166 Fundal Height: 34 cm Movement: Present     General:  Alert, oriented and cooperative. Patient is in no acute distress.  Skin: Skin is warm and dry. No rash noted.   Cardiovascular: Normal heart rate noted  Respiratory: Normal respiratory effort, no problems with respiration noted  Abdomen: Soft, gravid, appropriate for gestational age.  Pain/Pressure: Present     Pelvic: Cervical exam deferred        Extremities: Normal range of motion.  Edema: None  Mental Status: Normal mood and affect. Normal behavior. Normal judgment and thought content.   Assessment and Plan:  Pregnancy: [id number] at [redacted]w[redacted]d  1. Supervision of other high risk pregnancy,  antepartum -reviewed 4/8 BPP and [place]- posterior placenta, AFV wnl, 8/8, EFW 71% -taking PNV daily  -complaining of a lot of belly and back pain, denies HA, RUQ pain, edema, BP normotensive -gained 1.5# in 2 days, will check urine protein today with dip and spot protein -urine dip with trace protein, negative nitrites and leuks, and negative for glucose  2. Antepartum multigravida of advanced maternal age 74 -no additional testing indicated at this time -taking ASA  3. Obesity affecting pregnancy, antepartum, unspecified obesity type -taking ASA daily 11 lb 10.5 oz (5.287 kg) -exercise- no, cannot walk well   4. Polyhydramnios affecting pregnancy 06/03/22 -resolved per 4/8 eval with [facility], no further f/u indicated  5. H/O cesarean section x2 classical incisions -referral fax sent 3/14, but appt not set until 4/19 -delivery plans appt 4/19   Preterm labor symptoms and general obstetric precautions including but not limited to vaginal bleeding, contractions, leaking of fluid and fetal movement were reviewed in detail with the patient. Please refer to After Visit Summary for other counseling recommendations.  Return in about 2 weeks (around 08/21/2022) for Routine Prenatal Care.  Future Appointments  Date Time Provider Department Center  08/15/2022 10:15 AM Linzie Collin, MD [facility] None   Due to language barrier, interpreter Carline [phone]) was present for this visit.  Lenice Llamas, [place]

## 2022-08-07 NOTE — Progress Notes (Addendum)
[employer] Interpretor ID # [id number] -Swahili. Called 2 pediatrician offices with patient to see if they were taking new patients. Patient had decided on [facility]. Also called [facility] per patient's request to help her obtain baby supplies. Left message. Aware of delivery plans appointment 04/19. Next appointment scheduled for [facility] 04/25. Patient aware she needs to call for transportation. BTHIELE RN

## 2022-08-08 ENCOUNTER — Telehealth: Payer: Self-pay

## 2022-08-08 LAB — PROTEIN / CREATININE RATIO, URINE
Creatinine, Urine: 117.2 mg/dL
Protein, Ur: 23.8 mg/dL
Protein/Creat Ratio: 203 mg/g creat — ABNORMAL HIGH (ref 0–200)

## 2022-08-08 NOTE — Telephone Encounter (Signed)
Following review of labs by Aliene Altes FNP, client needs [facility] RV appt 08/14/2022. (08/21/22 appt remains scheduled. Call to client with [facility]pre[facility] ID # [phone] and work-in appt scheduled for 08/14/2022 with arrival time of 1300. Client is requesting assistance arranging transportation for appt.   Following call to client, call was made to [facility] and transportation arranged for appt with confirmation # [phone]. Return call to client with [facility] ID # [phone] to notify her transportation arranged and provide confirmation number. Client aware needs to be ready for pick-up by 1150. Client states she has already received a text from transportation with the above information. Jossie Ng, RN

## 2022-08-11 NOTE — Addendum Note (Signed)
Addended by: Heywood Bene on: 08/11/2022 12:57 PM   Modules accepted: Orders

## 2022-08-13 ENCOUNTER — Encounter: Payer: Medicaid Other | Admitting: Obstetrics and Gynecology

## 2022-08-14 ENCOUNTER — Ambulatory Visit: Payer: Medicaid Other | Admitting: Physician Assistant

## 2022-08-14 VITALS — BP 106/73 | HR 101 | Temp 97.1°F | Wt 176.0 lb

## 2022-08-14 DIAGNOSIS — Z98891 History of uterine scar from previous surgery: Secondary | ICD-10-CM

## 2022-08-14 DIAGNOSIS — O09529 Supervision of elderly multigravida, unspecified trimester: Secondary | ICD-10-CM

## 2022-08-14 DIAGNOSIS — O09899 Supervision of other high risk pregnancies, unspecified trimester: Secondary | ICD-10-CM

## 2022-08-14 DIAGNOSIS — O99210 Obesity complicating pregnancy, unspecified trimester: Secondary | ICD-10-CM

## 2022-08-14 LAB — URINALYSIS
Bilirubin, UA: NEGATIVE
Glucose, UA: NEGATIVE
Ketones, UA: NEGATIVE
Leukocytes,UA: NEGATIVE
Nitrite, UA: NEGATIVE
Protein,UA: NEGATIVE
RBC, UA: NEGATIVE
Specific Gravity, UA: 1.015 (ref 1.005–1.030)
Urobilinogen, Ur: 0.2 mg/dL (ref 0.2–1.0)
pH, UA: 7 (ref 5.0–7.5)

## 2022-08-14 NOTE — Progress Notes (Signed)
Patient here for MH RV at 35 weeks. Aware of delivery plans appointment tomorrow at [facility] and has ride set up. Also has appointment for next week set up too. Burt Knack, RN

## 2022-08-14 NOTE — Progress Notes (Signed)
Urine dip reviewed - negative results. Urine dip reviewed by A. Streilein PA-C. Client desires vaccine appts for herself, spouse and children at previously scheduled [facility]IC appt on 09/23/2022 at 1000. Call to Amy Widderich RN to ascertain how best to schedule appts. Per Amy, she will discuss with Araceli and call client with appts. [facility] called to pick up client and per Fleet Contras, ETA of driver is 15 minutes. Abdinassir, ID # [id number] utilized during call. Jossie Ng, RN

## 2022-08-14 NOTE — Progress Notes (Signed)
[facility]  PRENATAL VISIT NOTE  Subjective:  Lori Richards is a 37 y.o. [phone] at [redacted]w[redacted]d being seen today for ongoing prenatal care.  She is currently monitored for the following issues for this low-risk pregnancy and has Mooren's corneal ulcer, right eye; Supervision of other high risk pregnancy, antepartum; Antepartum multigravida of advanced maternal age 76; Obesity affecting pregnancy, antepartum; H/O cesarean section x2 classical incisions; Uterine fibroid 3.4x3.3x3.2 cm on 03/18/22 u/s; left benign ovarian cyst 2.7x2.6x2.6 cm on 02/06/22 u/s; and Polyhydramnios affecting pregnancy- resolved 4/8 on their problem list.  Patient reports  fatigue .  Contractions: Not present. Vag. Bleeding: None.  Movement: Present. Denies leaking of fluid/ROM.   The following portions of the patient's history were reviewed and updated as appropriate: allergies, current medications, past family history, past medical history, past social history, past surgical history and problem list. Problem list updated.  Objective:   Vitals:   08/14/22 1405  BP: 106/73  Pulse: (!) 101  Temp: (!) 97.1 F (36.2 C)  Weight: 176 lb (79.8 kg)    Fetal Status: Fetal Heart Rate (bpm): 160 Fundal Height: 37 cm Movement: Present  Presentation: Vertex  General:  Alert, oriented and cooperative. Patient is in no acute distress.  Skin: Skin is warm and dry. No rash noted.   Cardiovascular: Normal heart rate noted  Respiratory: Normal respiratory effort, no problems with respiration noted  Abdomen: Soft, gravid, appropriate for gestational age.  Pain/Pressure: Absent     Pelvic: Cervical exam deferred        Extremities: Normal range of motion.  Edema: None  Mental Status: Normal mood and affect. Normal behavior. Normal judgment and thought content.   Assessment and Plan:  Pregnancy: [id number] at [redacted]w[redacted]d  1. Supervision of other high risk pregnancy, antepartum Referred  pt to nursing to assist with coordination of [facility] and family vaccination appointments in May (at pt request), in light of transportation issues. Repeat urinalysis to check protein - negative. No indication for spot ACR today in light of 1 lb wt loss and no scatoma, RUQ pain or other preeclampsia sx. - Urinalysis  2. Obesity affecting pregnancy, antepartum, unspecified obesity type TWG 10 lb, continue daily low-dose aspirin  3. H/O cesarean section x2 classical incisions To keep delivery plans appt as sched tomorrow.  4. Antepartum multigravida of advanced maternal age 34 IOL sched at 60 wks, continue aspirin until delivery   Preterm labor symptoms and general obstetric precautions including but not limited to vaginal bleeding, contractions, leaking of fluid and fetal movement were reviewed in detail with the patient. Please refer to After Visit Summary for other counseling recommendations.  Return in about 1 week (around 08/21/2022) for Routine prenatal care.  Future Appointments  Date Time Provider Department Center  08/15/2022 10:15 AM Linzie Collin, MD [facility] None  08/21/2022  2:00 PM [facility] PROVIDER [facility] None    Landry Dyke, PA-C

## 2022-08-15 ENCOUNTER — Other Ambulatory Visit: Payer: Self-pay | Admitting: Obstetrics and Gynecology

## 2022-08-15 ENCOUNTER — Other Ambulatory Visit: Payer: Self-pay | Admitting: Family Medicine

## 2022-08-15 ENCOUNTER — Encounter: Payer: Self-pay | Admitting: Obstetrics and Gynecology

## 2022-08-15 ENCOUNTER — Ambulatory Visit (INDEPENDENT_AMBULATORY_CARE_PROVIDER_SITE_OTHER): Payer: Medicaid Other | Admitting: Obstetrics and Gynecology

## 2022-08-15 VITALS — BP 96/69 | HR 106 | Wt 175.9 lb

## 2022-08-15 DIAGNOSIS — Z01818 Encounter for other preprocedural examination: Secondary | ICD-10-CM

## 2022-08-15 DIAGNOSIS — Z3A35 35 weeks gestation of pregnancy: Secondary | ICD-10-CM | POA: Diagnosis not present

## 2022-08-15 DIAGNOSIS — O09899 Supervision of other high risk pregnancies, unspecified trimester: Secondary | ICD-10-CM

## 2022-08-15 DIAGNOSIS — O34219 Maternal care for unspecified type scar from previous cesarean delivery: Secondary | ICD-10-CM | POA: Diagnosis not present

## 2022-08-15 DIAGNOSIS — Z98891 History of uterine scar from previous surgery: Secondary | ICD-10-CM

## 2022-08-15 NOTE — Progress Notes (Signed)
Patient presents today to discuss delivery planning. She has a history of two classical cesarean deliveries in the past.

## 2022-08-15 NOTE — Progress Notes (Signed)
HPI:      [name] is a 37 y.o. [id number] who LMP was Patient's last menstrual period was 12/12/2021 (exact date).  Subjective:   She presents today for prenatal visit at 35 weeks.  She is currently a patient at the [facility] department.  She is thought to possibly have 2 prior classical cesarean deliveries in [place].  I have contacted [facility] department for confirmation and they are unable to confirm classical versus transverse uterine scar.  The patient does not know the indication for her cesarean delivery and certainly does not know which direction her scar on her uterus goes. Essentially this is 2 previous cesarean deliveries of unknown uterine scar. Patient desires a repeat cesarean delivery.    Hx: The following portions of the patient's history were reviewed and updated as appropriate:             She  has a past medical history of Mooren's corneal ulcer, right eye and Uterine fibroid 3.4x3.3x3.2 cm on 03/18/22 u/s (03/24/2022). She does not have any pertinent problems on file. She  has a past surgical history that includes Cesarean section and Eye surgery. Her family history includes Diabetes in her father. She  reports that she has never smoked. She has never been exposed to tobacco smoke. She has never used smokeless tobacco. She reports that she does not drink alcohol and does not use drugs. She has a current medication list which includes the following prescription(s): acetaminophen, aspirin ec, prednisolone acetate, and prenatal vitamin. She has No Known Allergies.       Review of Systems:  Review of Systems  Constitutional: Denied constitutional symptoms, night sweats, recent illness, fatigue, fever, insomnia and weight loss.  Eyes: Denied eye symptoms, eye pain, photophobia, vision change and visual disturbance.  Ears/Nose/Throat/Neck: Denied ear, nose, throat or neck symptoms, hearing loss, nasal discharge, sinus congestion and sore  throat.  Cardiovascular: Denied cardiovascular symptoms, arrhythmia, chest pain/pressure, edema, exercise intolerance, orthopnea and palpitations.  Respiratory: Denied pulmonary symptoms, asthma, pleuritic pain, productive sputum, cough, dyspnea and wheezing.  Gastrointestinal: Denied, gastro-esophageal reflux, melena, nausea and vomiting.  Genitourinary: Denied genitourinary symptoms including symptomatic vaginal discharge, pelvic relaxation issues, and urinary complaints.  Musculoskeletal: Denied musculoskeletal symptoms, stiffness, swelling, muscle weakness and myalgia.  Dermatologic: Denied dermatology symptoms, rash and scar.  Neurologic: Denied neurology symptoms, dizziness, headache, neck pain and syncope.  Psychiatric: Denied psychiatric symptoms, anxiety and depression.  Endocrine: Denied endocrine symptoms including hot flashes and night sweats.   Meds:   Current Outpatient Medications on File Prior to Visit  Medication Sig Dispense Refill   acetaminophen (TYLENOL) 500 MG tablet Take 500 mg by mouth every 6 (six) hours as needed for moderate pain.     aspirin EC 81 MG tablet Take 1 tablet (81 mg total) by mouth daily. Swallow whole.     prednisoLONE acetate (PRED FORTE) 1 % ophthalmic suspension Place 1 drop into the right eye 2 (two) times daily.     Prenatal Vit-Fe Fumarate-FA (PRENATAL VITAMIN) 27-0.8 MG TABS Take 1 tablet by mouth daily. 100 tablet 0   No current facility-administered medications on file prior to visit.      Objective:     Vitals:   08/15/22 1028  BP: 96/69  Pulse: (!) 106   Filed Weights   08/15/22 1028  Weight: 175 lb 14.4 oz (79.8 kg)              Abdominal examination reveals a midline abdominal  incision  Normal fetal heart tones baby is currently vertex by Thayer Ohm maneuvers          Assessment:    [id number] Patient Active Problem List   Diagnosis Date Noted   Polyhydramnios affecting pregnancy- resolved 4/8 06/05/2022   Uterine fibroid  3.4x3.3x3.2 cm on 03/18/22 u/s 03/24/2022   left benign ovarian cyst 2.7x2.6x2.6 cm on 02/06/22 u/s 03/24/2022   Supervision of other high risk pregnancy, antepartum 02/27/2022   Antepartum multigravida of advanced maternal age 65 02/27/2022   Obesity affecting pregnancy, antepartum 02/27/2022   H/O cesarean section x2 classical incisions 02/27/2022   Mooren's corneal ulcer, 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     1. Supervision of other high risk pregnancy, antepartum   2. History of 2 cesarean sections     Cesarean delivery -2 -underwent scar direction on the uterus   Plan:            1.  After speaking with Dr. Parke Poisson and discussing the case with him he recommended delivery after 37 weeks.  This is sort of a compromise between a cart recommendations at 36 weeks for known classical and 39 weeks for someone with a transverse incision.  I have scheduled her cesarean delivery for May 3 at 730.  I discussed this with her in detail the rationale for this decision was also reviewed.  All of her questions were answered.  Orders No orders of the defined types were placed in this encounter.   No orders of the defined types were placed in this encounter.     F/U  No follow-ups on file. I spent 35 minutes involved in the care of this patient preparing to see the patient by obtaining and reviewing her medical history (including labs, imaging tests and prior procedures), documenting clinical information in the electronic health record (EHR), counseling and coordinating care plans, writing and sending prescriptions, ordering tests or procedures and in direct communicating with the patient and medical staff discussing pertinent items from her history and physical exam.  Elonda Husky, M.D. 08/15/2022 12:32 PM

## 2022-08-19 ENCOUNTER — Telehealth: Payer: Self-pay | Admitting: Obstetrics and Gynecology

## 2022-08-19 NOTE — Telephone Encounter (Signed)
Connected with patient on 08/19/2022 at 10:20am via interpreter line ([id number]). Patient has been provided upcoming appt details prior to surgery and post surgery. Patient verbalized understanding.

## 2022-08-21 ENCOUNTER — Ambulatory Visit: Payer: Medicaid Other | Admitting: Advanced Practice Midwife

## 2022-08-21 VITALS — BP 104/70 | HR 102 | Temp 97.4°F | Wt 175.0 lb

## 2022-08-21 DIAGNOSIS — Z98891 History of uterine scar from previous surgery: Secondary | ICD-10-CM

## 2022-08-21 DIAGNOSIS — O09899 Supervision of other high risk pregnancies, unspecified trimester: Secondary | ICD-10-CM

## 2022-08-21 DIAGNOSIS — O09529 Supervision of elderly multigravida, unspecified trimester: Secondary | ICD-10-CM

## 2022-08-21 DIAGNOSIS — O99210 Obesity complicating pregnancy, unspecified trimester: Secondary | ICD-10-CM

## 2022-08-21 NOTE — Progress Notes (Signed)
Kept 08/15/2022 delivery plans appt and repeat C-Section scheduled for 08/29/2022 at 0730. Client aware of 08/25/22 pre-admit testing appt (phone call per [facility] appt desk). 36 week cultures today. Aware of 09/17/22 WIC appt at 1030. Desires immunization appts for children same day as this appt and A. Widderich RN, Fish farm manager and working this out with client. Abuse Assessment Form and PHQ-9 read to client with Swahili interpreter. Jossie Ng, RN

## 2022-08-21 NOTE — Progress Notes (Signed)
[facility]  PRENATAL VISIT NOTE  Subjective:  Lori Richards is a 37 y.o. [phone] at [redacted]w[redacted]d being seen today for ongoing prenatal care.  She is currently monitored for the following issues for this high-risk pregnancy and has Mooren's corneal ulcer, right eye; Supervision of other high risk pregnancy, antepartum; Antepartum multigravida of advanced maternal age 60; Obesity affecting pregnancy, antepartum; H/O cesarean section x2 classical incisions; Uterine fibroid 3.4x3.3x3.2 cm on 03/18/22 u/s; left benign ovarian cyst 2.7x2.6x2.6 cm on 02/06/22 u/s; and Polyhydramnios affecting pregnancy- resolved 4/8 on their problem list.  Patient reports  occassional contractions .  Contractions: Not present. Vag. Bleeding: None.  Movement: Present. Denies leaking of fluid/ROM.   The following portions of the patient's history were reviewed and updated as appropriate: allergies, current medications, past family history, past medical history, past social history, past surgical history and problem list. Problem list updated.  Objective:   Vitals:   08/21/22 1432  BP: 104/70  Pulse: (!) 102  Temp: (!) 97.4 F (36.3 C)  Weight: 175 lb (79.4 kg)    Fetal Status: Fetal Heart Rate (bpm): 140 Fundal Height: 37 cm Movement: Present  Presentation: Vertex  General:  Alert, oriented and cooperative. Patient is in no acute distress.  Skin: Skin is warm and dry. No rash noted.   Cardiovascular: Normal heart rate noted  Respiratory: Normal respiratory effort, no problems with respiration noted  Abdomen: Soft, gravid, appropriate for gestational age.  Pain/Pressure: Absent     Pelvic: Cervical exam performed Dilation: Closed Effacement (%): Thick Station: Ballotable  Extremities: Normal range of motion.  Edema: None  Mental Status: Normal mood and affect. Normal behavior. Normal judgment and thought content.   Assessment and Plan:  Pregnancy: [id number] at  [redacted]w[redacted]d  1. Supervision of other high risk pregnancy, antepartum Not working No car seat or crib yet Knows when to go to L&D GC/Chlamydia/GBS cultures done today Using steroid drops in right eye BID  - Culture, beta strep (group b only) - Chlamydia/GC NAA, Confirmation  2. H/O cesarean section x2 classical incisions Kept delivery plans apt 08/15/22 and repeat c/s scheduled 08/29/22  3. Antepartum multigravida of advanced maternal age 55 Genetic counseling apt 03/18/22 NIPS neg 03/18/22  4. Obesity affecting pregnancy, antepartum, unspecified obesity type Taking ASA 81 mg daily 9 lb 10.5 oz (4.379 kg) 1 lb wt loss in last 2 wks Pt states has food stamps and has enough food at home   Preterm labor symptoms and general obstetric precautions including but not limited to vaginal bleeding, contractions, leaking of fluid and fetal movement were reviewed in detail with the patient. Please refer to After Visit Summary for other counseling recommendations.  Return in about 4 days (around 08/25/2022) for routine PNC.  Future Appointments  Date Time Provider Department Center  08/25/2022  2:00 PM [facility] None  09/05/2022  9:35 AM Linzie Collin, MD [facility] None    Alberteen Spindle, CNM

## 2022-08-25 ENCOUNTER — Encounter
Admission: RE | Admit: 2022-08-25 | Discharge: 2022-08-25 | Disposition: A | Discharge status: Home / Self Care | Payer: Medicaid Other | Source: Ambulatory Visit | Attending: Obstetrics and Gynecology | Admitting: Obstetrics and Gynecology

## 2022-08-25 ENCOUNTER — Ambulatory Visit: Payer: Medicaid Other

## 2022-08-25 ENCOUNTER — Other Ambulatory Visit: Payer: Self-pay

## 2022-08-25 LAB — CULTURE, BETA STREP (GROUP B ONLY): Strep Gp B Culture: NEGATIVE

## 2022-08-25 LAB — CHLAMYDIA/GC NAA, CONFIRMATION
Chlamydia trachomatis, NAA: NEGATIVE
Neisseria gonorrhoeae, NAA: NEGATIVE

## 2022-08-25 NOTE — Patient Instructions (Addendum)
Your procedure is scheduled on: Friday 08/29/22  Arrival Time: Please call Labor and Delivery the day before your scheduled C-Section to find out your arrival time. [phone].  Arrival: If your arrival time is prior to 6:00 am, please enter through the Emergency Room Entrance and you will be directed to Labor and Delivery. If your arrival time is 6:00 am or later, please enter the Medical Mall and follow the greeter's instructions.  REMEMBER: Instructions that are not followed completely may result in serious medical risk, up to and including death; or upon the discretion of your surgeon and anesthesiologist your surgery may need to be rescheduled.  Do not eat food or drink any liquids after midnight the night before surgery.  No gum chewing or hard candies.  You may however, drink CLEAR liquids up to 2 hours before you are scheduled to arrive for your surgery. Do not drink anything within 2 hours of your scheduled arrival time.  One week prior to surgery: Stop Anti-inflammatories (NSAIDS) such as Advil, Aleve, Ibuprofen, Motrin, Naproxen, Naprosyn and Aspirin based products such as Excedrin, Goody's Powder, BC Powder. Stop ANY OVER THE COUNTER supplements until after surgery. You may however, continue to take Tylenol if needed for pain up until the day of surgery.  Continue taking all prescribed medications.   TAKE ONLY THESE MEDICATIONS THE MORNING OF SURGERY WITH A SIP OF WATER:  none  No Alcohol for 24 hours before or after surgery.  No Smoking including e-cigarettes for 24 hours prior to surgery.  No chewable tobacco products for at least 6 hours prior to surgery.  No nicotine patches on the day of surgery.  Do not use any "recreational" drugs for at least a week prior to your surgery.  Please be advised that the combination of cocaine and anesthesia may have negative outcomes, up to and including death. If you test positive for cocaine, your surgery will be cancelled.  On  the morning of surgery brush your teeth with toothpaste and water, you may rinse your mouth with mouthwash if you wish. Do not swallow any toothpaste or mouthwash.  Use CHG wipes as directed on instruction sheet.  Do not wear jewelry, make-up, hairpins, clips or nail polish.  Do not wear lotions, powders, or perfumes.   Do not shave body hair from the neck down 48 hours before surgery.  Contact lenses, hearing aids and dentures may not be worn into surgery.  Do not bring valuables to the hospital. [facility] is not responsible for any missing/lost belongings or valuables.   Notify your doctor if there is any change in your medical condition (cold, fever, infection).  Wear comfortable clothing (specific to your surgery type) to the hospital.  After surgery, you can help prevent lung complications by doing breathing exercises.  Take deep breaths and cough every 1-2 hours. Your doctor may order a device called an Incentive Spirometer to help you take deep breaths. When coughing or sneezing, hold a pillow firmly against your incision with both hands. This is called "splinting." Doing this helps protect your incision. It also decreases belly discomfort.  Please call the Pre-admissions Testing Dept. at [phone] if you have any questions about these instructions.  Surgery Visitation Policy:  Visitor Passes   All visitors, including children, need an identification sticker when visiting. These stickers must be worn where they can be seen.   Labor & Delivery  Laboring women may have one designated support person and two other visitors of any age  visit. The support person must remain the same. The visitors may switch with other visitors. Visitation is permitted 24 hours per day. The designated support person or a visitor over the age of 16 may sleep overnight in the patient's room. A doula registered with [facility] for labor and delivery support is not considered a visitor.  Doulas not registered with [facility] are considered visitors.  Mother Baby Unit, OB Specialty and Gynecological Care  A designated support person and three visitors of any age may visit. The three visitors may switch out. The designated support person or a visitor age 30 or older may stay overnight in the room. During the postpartum period (up to 6 weeks), if the mother is the patient, she can have her newborn stay with her if there is another support person present who can be responsible for the baby.    Utaratibu wako umepangwa mnamo: Ijumaa 08/29/22  Muda wa Clementeen HoofEdmonia Lynch pigia simu [place] na Judene Companion siku moja kabla ya Sehemu yako ya C iliyoratibiwa ili Ralene Muskrat [place]. [phone].  Clementeen HoofLavonna Monarch muda wako wa kuwasili ni kabla ya 6:00 asubuhi, tafadhali ingia [place] Ingilio la [place] cha [place] na utaelekezwa Leba na Uwasilishaji. Ikiwa muda wako wa kuwasili ni 6:00 asubuhi au baadaye, tafadhali ingia [employer] Duka la Dawa na ufuate maagizo ya mpokeaji salamu.  KUMBUKAJunious Dresser UJWJXBJYNW GNFAOZ yanaweza kusababisha hatari kubwa ya matibabu, hadi na pamoja na kifo; au kwa [place] wa daktari-mpasuaji wako na daktari wa ganzi upasuaji wako unaweza kuhitaji [place].  Usile chakula au kunywa maji yoyote baada ya saa sita usiku kabla ya upasuaji. Hakuna kutafuna gum au pipi ngumu.  Hata hivyo, Larey Brick vimiminika VYA WAZI hadi saa 2 kabla ya [place] kwa [place]. Usinywe chochote ndani ya saa 2 za wakati ulioratibiwa wa [place].  Wiki moja kabla ya upasuaji: Almira Coaster Dawa za [place] na Inflammatories (NSAIDS) kama vile Advil, Aleve, Ibuprofen, Motrin, Naproxen, Naprosyn na Aspirin [place] na bidhaa kama vile Excedrin, Goody's Poda, BC Poda. Acha virutubisho VYOVYOTE JUU YA KAUNTI hadi baada ya upasuaji. Hata hivyo, Albin Fischer kuchukua Tylenol ikiwa inahitajika kwa maumivu hadi siku ya upasuaji.  Endelea kuchukua dawa  zote zilizoagizwa.  CHUKUA DAWA HIZI TU ASUBUHI YA UPASUAJI KWA MPIGO YA MAJI:  1. hakuna  Hakuna Pombe kwa masaa 24 kabla au baada ya upasuaji.  [place] Sigara pamoja na sigara za elektroniki kwa masaa 24 kabla ya upasuaji. Hakuna bidhaa za tumbaku zinazoweza kutafuna kwa angalau masaa 6 kabla ya upasuaji. Hakuna mabaka ya nikotini siku ya upasuaji.  Usitumie dawa zozote za "kujiburudisha" kwa angalau wiki moja kabla ya upasuaji wako. Tafadhali fahamu [place] mchanganyiko wa kokeini na ganzi unaweza kuwa na matokeo [place], [place] na ikiwa ni pamoja na kifo. Ukipimwa na kukutwa na kokeini, upasuaji [place].  Asubuhi ya upasuaji mswaki meno yako na dawa ya meno na maji, unaweza suuza kinywa chako na mouthwash kama unataka. Usimeze dawa yoyote ya meno au [employer].  Tumia wipes za CHG kama ilivyoelekezwa kwenye karatasi Audiological scientist.  Usivae vito, vipodozi, pini za nywele, klipu au rangi [place].  Usivae losheni, poda, au manukato.  Usinyoe nywele za mwili kutoka shingo hadi chini masaa 48 kabla ya upasuaji.  George Hugh Seward, [place] vya kusaidia Carollee Massed na meno bandia haziwezi [place] wa [place].  Usilete vitu vya thamani hospitalini. [place] haiwajibikii kitu chochote kilichokosekana/kupotea au vitu vya thamani.  Mjulishe daktari wako ikiwa [place] mabadiliko yoyote [place] hali yako ya matibabu (baridi, [place]).  Vaa nguo za starehe (maalum kwa aina ya upasuaji wako) hadi hospitalini.  Baada ya upasuaji, unaweza kusaidia [place] matatizo ya mapafu kwa kufanya mazoezi ya kupumua. [place] pumzi ya kina na [place] kila masaa 1-2. Daktari wako Nada Libman kifaa [employer] Spirometer ili kukusaidia kupumua kwa kina. [place] au [place] chafya, shikilia mto kwa nguvu dhidi ya chale yako kwa mikono yote miwili. Hii inaitwa "kuunganisha". Kufanya hivi husaidia kulinda chale yako. Pia hupunguza usumbufu wa tumbo.  Tafadhali pigia simu  Idara ya Majaribio Suann Larry kwa [phone] ikiwa una maswali yoyote kuhusu maagizo haya.  Sera ya Bruce Donath Upasuaji:  Pasi za Mgeni  Wageni wote, wakiwemo watoto, wanahitaji kibandiko cha utambulisho wanapotembelea. Stika hizi lazima zivaliwe mahali zinapoweza [place].  Kazi na Utoaji  [place] wanaozaa wanaweza kuwa na mtu mmoja aliyeteuliwa wa usaidizi na wageni wengine wawili wa kutembelea umri wowote. Mtu wa msaada lazima abaki sawa. Wageni wanaweza kubadilisha na wageni wengine. Kutembelea kunaruhusiwa masaa 24 kwa siku. Msaidizi aliyeteuliwa au mgeni zaidi ya umri wa miaka 16 anaweza [place] usiku mmoja [place] chumba cha [place]. Doula aliyesajiliwa na [place] kwa usaidizi wa leba na kujifungua hachukuliwi kama mgeni. Doulas ambazo hazijasajiliwa na [place] zinachukuliwa kuwa wageni.  Kitengo MeadWestvaco, [place] wa [place] na [place] wa [place] ya [place]  Mtu aliyeteuliwa wa usaidizi na wageni watatu wa umri wowote Cyndi Bender. Wageni watatu Federico Flake. Mtu aliyeteuliwa wa usaidizi au mgeni aliye na umri wa [place] 16 au zaidi anaweza [place] chumba hicho usiku [place]. Katika kipindi cha baada ya [place] (hadi wiki 6), ikiwa mama ndiye [place], [place] mtoto wake mchanga [place] naye [place] msaidizi mwingine ambaye anaweza kuwajibika kwa mtoto.

## 2022-08-26 ENCOUNTER — Encounter: Payer: Medicaid Other | Admitting: Obstetrics and Gynecology

## 2022-08-26 ENCOUNTER — Ambulatory Visit: Payer: Medicaid Other

## 2022-08-27 ENCOUNTER — Observation Stay
Admission: EM | Admit: 2022-08-27 | Discharge: 2022-08-27 | Disposition: A | Discharge status: Home / Self Care | Payer: Medicaid Other | Attending: Advanced Practice Midwife | Admitting: Advanced Practice Midwife

## 2022-08-27 ENCOUNTER — Encounter: Payer: Self-pay | Admitting: Certified Nurse Midwife

## 2022-08-27 ENCOUNTER — Ambulatory Visit: Payer: Medicaid Other | Admitting: Advanced Practice Midwife

## 2022-08-27 ENCOUNTER — Other Ambulatory Visit: Payer: Self-pay

## 2022-08-27 ENCOUNTER — Other Ambulatory Visit: Payer: Medicaid Other

## 2022-08-27 VITALS — BP 111/76 | HR 97 | Temp 97.1°F

## 2022-08-27 DIAGNOSIS — Z7982 Long term (current) use of aspirin: Secondary | ICD-10-CM | POA: Insufficient documentation

## 2022-08-27 DIAGNOSIS — O09523 Supervision of elderly multigravida, third trimester: Secondary | ICD-10-CM | POA: Diagnosis not present

## 2022-08-27 DIAGNOSIS — O26893 Other specified pregnancy related conditions, third trimester: Secondary | ICD-10-CM | POA: Diagnosis present

## 2022-08-27 DIAGNOSIS — Z3A36 36 weeks gestation of pregnancy: Secondary | ICD-10-CM | POA: Diagnosis not present

## 2022-08-27 DIAGNOSIS — O99210 Obesity complicating pregnancy, unspecified trimester: Secondary | ICD-10-CM

## 2022-08-27 DIAGNOSIS — O99213 Obesity complicating pregnancy, third trimester: Secondary | ICD-10-CM

## 2022-08-27 DIAGNOSIS — O09529 Supervision of elderly multigravida, unspecified trimester: Secondary | ICD-10-CM

## 2022-08-27 DIAGNOSIS — O09899 Supervision of other high risk pregnancies, unspecified trimester: Secondary | ICD-10-CM

## 2022-08-27 DIAGNOSIS — O409XX0 Polyhydramnios, unspecified trimester, not applicable or unspecified: Secondary | ICD-10-CM

## 2022-08-27 DIAGNOSIS — O09893 Supervision of other high risk pregnancies, third trimester: Secondary | ICD-10-CM

## 2022-08-27 DIAGNOSIS — O0993 Supervision of high risk pregnancy, unspecified, third trimester: Secondary | ICD-10-CM | POA: Diagnosis not present

## 2022-08-27 DIAGNOSIS — R109 Unspecified abdominal pain: Secondary | ICD-10-CM | POA: Diagnosis not present

## 2022-08-27 DIAGNOSIS — N83209 Unspecified ovarian cyst, unspecified side: Secondary | ICD-10-CM

## 2022-08-27 DIAGNOSIS — D259 Leiomyoma of uterus, unspecified: Secondary | ICD-10-CM

## 2022-08-27 DIAGNOSIS — Z98891 History of uterine scar from previous surgery: Secondary | ICD-10-CM

## 2022-08-27 DIAGNOSIS — O3480 Maternal care for other abnormalities of pelvic organs, unspecified trimester: Secondary | ICD-10-CM

## 2022-08-27 LAB — CBC WITH DIFFERENTIAL/PLATELET
Abs Immature Granulocytes: 0.03 10*3/uL (ref 0.00–0.07)
Basophils Absolute: 0 10*3/uL (ref 0.0–0.1)
Basophils Relative: 1 %
Eosinophils Absolute: 0.1 10*3/uL (ref 0.0–0.5)
Eosinophils Relative: 1 %
HCT: 30.6 % — ABNORMAL LOW (ref 36.0–46.0)
Hemoglobin: 9.8 g/dL — ABNORMAL LOW (ref 12.0–15.0)
Immature Granulocytes: 1 %
Lymphocytes Relative: 30 %
Lymphs Abs: 1.9 10*3/uL (ref 0.7–4.0)
MCH: 26.3 pg (ref 26.0–34.0)
MCHC: 32 g/dL (ref 30.0–36.0)
MCV: 82 fL (ref 80.0–100.0)
Monocytes Absolute: 0.5 10*3/uL (ref 0.1–1.0)
Monocytes Relative: 8 %
Neutro Abs: 3.8 10*3/uL (ref 1.7–7.7)
Neutrophils Relative %: 59 %
Platelets: 166 10*3/uL (ref 150–400)
RBC: 3.73 MIL/uL — ABNORMAL LOW (ref 3.87–5.11)
RDW: 15.6 % — ABNORMAL HIGH (ref 11.5–15.5)
WBC: 6.4 10*3/uL (ref 4.0–10.5)
nRBC: 0 % (ref 0.0–0.2)

## 2022-08-27 LAB — URINALYSIS, COMPLETE (UACMP) WITH MICROSCOPIC
Bilirubin Urine: NEGATIVE
Glucose, UA: NEGATIVE mg/dL
Hgb urine dipstick: NEGATIVE
Ketones, ur: NEGATIVE mg/dL
Leukocytes,Ua: NEGATIVE
Nitrite: NEGATIVE
Protein, ur: NEGATIVE mg/dL
Specific Gravity, Urine: 1.01 (ref 1.005–1.030)
pH: 7 (ref 5.0–8.0)

## 2022-08-27 LAB — ABO/RH: ABO/RH(D): A POS

## 2022-08-27 LAB — TYPE AND SCREEN: Antibody Screen: NEGATIVE

## 2022-08-27 MED ORDER — MORPHINE SULFATE (PF) 4 MG/ML IV SOLN
4.0000 mg | Freq: Once | INTRAVENOUS | Status: AC
  Administered 2022-08-27: 4 mg via INTRAMUSCULAR
  Filled 2022-08-27: qty 1

## 2022-08-27 MED ORDER — SODIUM CHLORIDE 0.9 % IV SOLN
25.0000 mg | Freq: Once | INTRAVENOUS | Status: AC
  Administered 2022-08-27: 25 mg via INTRAVENOUS
  Filled 2022-08-27: qty 1

## 2022-08-27 MED ORDER — TERBUTALINE SULFATE 1 MG/ML IJ SOLN
0.2500 mg | Freq: Once | INTRAMUSCULAR | Status: AC
  Administered 2022-08-27: 0.25 mg via SUBCUTANEOUS
  Filled 2022-08-27: qty 1

## 2022-08-27 MED ORDER — LACTATED RINGERS IV BOLUS
1000.0000 mL | Freq: Once | INTRAVENOUS | Status: AC
  Administered 2022-08-27: 1000 mL via INTRAVENOUS

## 2022-08-27 MED ORDER — LACTATED RINGERS IV SOLN: INTRAVENOUS | Status: DC

## 2022-08-27 MED ORDER — MORPHINE SULFATE (PF) 4 MG/ML IV SOLN
4.0000 mg | Freq: Once | INTRAVENOUS | Status: AC
  Administered 2022-08-27: 4 mg via INTRAVENOUS
  Filled 2022-08-27: qty 1

## 2022-08-27 MED ORDER — SOD CITRATE-CITRIC ACID 500-334 MG/5ML PO SOLN: 30.0000 mL | ORAL | Status: DC | PRN

## 2022-08-27 MED ORDER — ACETAMINOPHEN 325 MG PO TABS: 650.0000 mg | ORAL_TABLET | ORAL | Status: DC | PRN

## 2022-08-27 NOTE — H&P (Signed)
OB History & Physical   History of Present Illness:  Electronic interpreter present during visit  Chief Complaint: contractions  HPI:  Lori Richards is a 37 y.o. [id number] female at [redacted]w[redacted]d dated by LMP.  Her pregnancy has been complicated by Mooren's corneal ulcer, right eye; Supervision of other high risk pregnancy, antepartum; Antepartum multigravida of advanced maternal age 68; Obesity affecting pregnancy, antepartum; H/O cesarean section x2 classical incisions; Uterine fibroid 3.4x3.3x3.2 cm on 03/18/22 u/s; left benign ovarian cyst 2.7x2.6x2.6 cm on 02/06/22 u/s; and Polyhydramnios affecting pregnancy.   [facility] reporting contractions for the past 2 days. Due to her level of pain she was advised to go to L&D. Her cervix was closed in clinic. She arrived to L&D and was admitted for observation, placed on monitors. Her cervix was rechecked and found to be closed still. She was having a lot of pain in her back and lower abdomen. We discussed starting IV, having fluid bolus, dose of terbutaline and morphine/phenergan therapeutic rest which she was agreeable to.   She is scheduled for repeat c/section in 2 days.  She reports contractions.   She denies leakage of fluid.   She denies vaginal bleeding.   She reports fetal movement.    Total weight gain for pregnancy: 4.379 kg   Obstetrical Problem List: Sifa Mvumanyi, Nargis Problems (from 02/27/22 to present)     Problem Noted Resolved   Polyhydramnios affecting pregnancy- resolved 4/8 06/05/2022 by Lenice Llamas, FNP No   Overview Addendum 08/07/2022  2:19 PM by Lenice Llamas, FNP    2/6 ultrasound- polyhydramnios- MVP 9.19cm, EFW 79%, placenta- fundal, 3 VC, normal anatomy 07/07/22 = mild polyhydramnios- posterior placenta, EFW 82% 08/04/22= BPP 8/8, posterior placenta, AFV wnl, EFW 71%- no further f/u recommended       Supervision of other high risk pregnancy, antepartum  02/27/2022 by Landry Dyke, PA-C No   Overview Addendum 08/25/2022 10:15 AM by Jossie Ng, RN     Nursing Staff Provider  Office Location [facility] Dating  On 03/18/22- consistent with LMP. EDD 09/18/22  Language  Swahili Anatomy US  On 04/29/22@20  4/7=wnl  Flu Vaccine  03/06/2022 Genetic Screen  NIPS: test cancelled on 02/27/22; redrawn 11/21 = neg (female) AFP: 05/02/22 = negative    TDaP vaccine  Given 06/26/22 Hgb A1C or  GTT Early 1 hour gtt = 105             (02/27/22) Third trimester: 1 hr gtt = 133 (06/26/22)  COVID vaccine 1 dose Janssen, 07/19/2021    Rhogam     LAB RESULTS   Feeding Plan Breast Blood Type  A positive            (02/27/22)  Contraception Natural Family Planning (07/10/22) Antibody  Negative              (02/27/22)  Circumcision  Rubella  Immune by titer   (02/27/22)  Pediatrician  [place] RPR  Non-reactive       (02/27/22)  Support Person [place], husband HBsAg  Negative             (02/27/22)  Prenatal Classes  HIV  Non-reactive        (02/27/22)  @28wk - Doula referral?  Varicella Immune by titer     (08/08/21)    HCV  Non-reactive          (02/27/22)  BTL Consent  GBS  Negative               (  08/21/22)    Angela Burke Negative                (08/08/21)  VBAC Consent  Pap 02/27/22=Neg HPV=Neg    Hgb Electro  Normal Hemoglobin   (08/08/21)  BP Cuff ordered  CF   Delivery Group [facility]   Centering Group  OBCM involved           Antepartum multigravida of advanced maternal age 56 02/27/2022 by Landry Dyke, PA-C No   Overview Addendum 08/07/2022  2:12 PM by Lenice Llamas, FNP    Genetic counseling apt 03/18/22 [x ] counseled about Genetic screening (NIPT preferred method for >35yo) [ x] Detailed [place] at 18-20 wks  If mom will be >= 40 at delivery [ ]  NST/AFI weekly starting at 36 wk [ ]  Kick counts [ ]  IOL at 40 wks       Obesity affecting pregnancy, antepartum 02/27/2022 by Landry Dyke, PA-C No   Overview Addendum 08/07/2022  2:13 PM by  Lenice Llamas, FNP    Recommendations [ x] Aspirin 81 mg daily taking as of 05/02/22 [ ]  Nutrition consult [ x] Weight gain 11-20 lbs for singleton and 25-35 lbs for twin pregnancy (IOM guidelines) Higher class of obesity patients recommended to gain closer to lower limit  Weight loss is associated with adverse outcomes [ ]  Baseline and surveillance labs (pulled in from [facility], refresh links as needed)  Lab Results  Component Value Date   PLT 233 02/27/2022   CREATININE 0.54 (L) 02/27/2022   AST 11 02/27/2022   ALT 9 02/27/2022   Antenatal Testing: [ x] Growth scans every 4-6 weeks as needed (fundal height likely inadequate in morbidly obese patients) [ ]  @ [facility] start NST 2x weekly at 36  Postpartum Care: [ ]  Consider prophylactic wound vac/PICO for C/S [ ]  Lovenox for DVT/PE prophylaxis (6 hours after vaginal delivery, 12 hours after C/S).   Lovenox 40 mg [place] q24h (BMI 30.0-39.9 kg/m2)  Lovenox 0.5 mg/kg [place] q12h ((BMI ?40 kg/m2 ); Max 150 mg [place] q12h.  Consider prolonged therapy x 6 weeks PP in very concerning patients (I.e morbid obesity with other co-morbidities that increase risk of DVT/PE) [ ]  Counsel about diet, exercise and weight loss. Referrals PRN.            Maternal Medical History:   Past Medical History:  Diagnosis Date   Mooren's corneal ulcer, right eye    Per IOM Documents   Uterine fibroid 3.4x3.3x3.2 cm on 03/18/22 u/s 03/24/2022    Past Surgical History:  Procedure Laterality Date   CESAREAN SECTION     C Section 2019 and 2021   EYE SURGERY      No Known Allergies  Prior to Admission medications   Medication Sig Start Date End Date Taking? Authorizing Provider  acetaminophen (TYLENOL) 500 MG tablet Take 500 mg by mouth every 6 (six) hours as needed for moderate pain.    [provider]  aspirin EC 81 MG tablet Take 1 tablet (81 mg total) by mouth daily. Swallow whole. 02/27/22 09/19/22  Streilein, Mathis Dad, PA-C  prednisoLONE acetate  (PRED FORTE) 1 % ophthalmic suspension Place 1 drop into the right eye 2 (two) times daily. Patient not taking: Reported on 08/27/2022 05/02/22   [provider]  Prenatal Vit-Fe Fumarate-FA (PRENATAL VITAMIN) 27-0.8 MG TABS Take 1 tablet by mouth daily. 03/26/22   Federico Flake, MD    OB History  Gravida Para Term Preterm AB Living  4 2 2  0 1 2  SAB IAB Ectopic Multiple Live Births  1 0 0   2    # Outcome Date GA Lbr Len/2nd Weight Sex Delivery Anes PTL Lv  4 Current           3 Term 06/02/19    F CS-Unspec   LIV  2 Term 04/09/18    M CS-Unspec   LIV  1 SAB 2016            Prenatal care site: [facility]  Social History: She  reports that she has never smoked. She has never been exposed to tobacco smoke. She has never used smokeless tobacco. She reports that she does not drink alcohol and does not use drugs.  Family History: family history includes Diabetes in her father.    Review of Systems:  Review of Systems  Constitutional:  Negative for chills and fever.  HENT:  Negative for congestion, ear discharge, ear pain, hearing loss, sinus pain and sore throat.   Eyes:  Negative for blurred vision and double vision.  Respiratory:  Negative for cough, shortness of breath and wheezing.   Cardiovascular:  Negative for chest pain, palpitations and leg swelling.  Gastrointestinal:  Positive for abdominal pain. Negative for blood in stool, constipation, diarrhea, heartburn, melena, nausea and vomiting.  Genitourinary:  Negative for dysuria, flank pain, frequency, hematuria and urgency.  Musculoskeletal:  Positive for back pain. Negative for joint pain and myalgias.  Skin:  Negative for itching and rash.  Neurological:  Negative for dizziness, tingling, tremors, sensory change, speech change, focal weakness, seizures, loss of consciousness, weakness and headaches.  Endo/Heme/Allergies:  Negative for environmental allergies. Does not bruise/bleed easily.  Psychiatric/Behavioral:   Negative for depression, hallucinations, memory loss, substance abuse and suicidal ideas. The patient is not nervous/anxious and does not have insomnia.      Physical Exam:  BP 108/66 (BP Location: Left Arm)   Pulse 80   Temp (!) 97.5 F (36.4 C) (Oral)   Resp 20   LMP 12/12/2021 (Exact Date)   Constitutional: Well nourished, well developed female in no acute distress.  HEENT: normal Skin: Warm and dry.  Cardiovascular: Regular rate and rhythm.   Extremity:  no edema   Respiratory: Clear to auscultation bilateral. Normal respiratory effort Abdomen: FHT present Back: no CVAT Psych: Alert and Oriented x3. No memory deficits. Normal mood and affect.  MS: normal gait, normal bilateral lower extremity ROM/strength/stability.  Pelvic exam: (female chaperone present) is not limited by body habitus EGBUS: within normal limits Vagina: within normal limits and with normal mucosa  Cervix: closed/thick   Baseline FHR: 140 beats/min   Variability: moderate   Accelerations: present   Decelerations: absent Contractions: present frequency: irregular Overall assessment: reassuring   No results found for: "SARSCOV2NAA"  Assessment:  Kynleigh Artz is a 37 y.o. [id number] female at [redacted]w[redacted]d with preterm contractions.   Plan:  Admit to Labor & Delivery for observation CBC, T&S, IVF- bolus/continuous, RPR, UA GBS negative.   Fetal well-being: Category I Contractions: terbutaline, morphine/phenergan rest    Tresea Mall, [facility] 08/27/2022 7:46 PM

## 2022-08-27 NOTE — Progress Notes (Signed)
Here today for 36.6 week MH RV. Taking PNV and ASA QD. Patient is tearful, walking with assistance and appears to be in pain. Patient states she is "hurting in my back and groin." Has repeat C-Section scheduled for 08/29/22 @ 7:30. E. Sciora, CNM in to evaluate patient. Tawny Hopping, RN

## 2022-08-27 NOTE — Discharge Summary (Signed)
Physician Final Progress Note  Patient ID: Lori Richards MRN: [id number] DOB/AGE: 35-Aug-1987 35 y.o.  Admit date: 08/27/2022 Admitting provider: Linzie Collin, MD Discharge date: 08/27/2022   Admission Diagnoses:  1) intrauterine pregnancy at [redacted]w[redacted]d  2) contractions  Discharge Diagnoses:  Principal Problem:   Labor and delivery indication for care or intervention Active Problems:   [redacted] weeks gestation of pregnancy  Not laboring  History of Present Illness: The patient is a 36 y.o. female [phone] at [redacted]w[redacted]d who presents for painful contractions for the past 2 days. She was admitted for observation and placed on monitors. Cervical exam. Labs collected. Patient was offered morphine rest and IV fluids which she accepted. When she woke up she reports feeling better with fewer contractions. Cervix is closed. She is discharged to home with instructions and precautions including staying well hydrated and increased rest tomorrow. She will return on Friday for scheduled c/section.   Past Medical History:  Diagnosis Date   Mooren's corneal ulcer, right eye    Per IOM Documents   Uterine fibroid 3.4x3.3x3.2 cm on 03/18/22 u/s 03/24/2022    Past Surgical History:  Procedure Laterality Date   CESAREAN SECTION     C Section 2019 and 2021   EYE SURGERY      No current facility-administered medications on file prior to encounter.   Current Outpatient Medications on File Prior to Encounter  Medication Sig Dispense Refill   acetaminophen (TYLENOL) 500 MG tablet Take 500 mg by mouth every 6 (six) hours as needed for moderate pain.     aspirin EC 81 MG tablet Take 1 tablet (81 mg total) by mouth daily. Swallow whole.     prednisoLONE acetate (PRED FORTE) 1 % ophthalmic suspension Place 1 drop into the right eye 2 (two) times daily. (Patient not taking: Reported on 08/27/2022)     Prenatal Vit-Fe Fumarate-FA (PRENATAL VITAMIN) 27-0.8 MG TABS Take 1 tablet by mouth daily. 100 tablet 0    No  Known Allergies  Social History   Socioeconomic History   Marital status: Single    Spouse name: Jill Alexanders   Number of children: 2   Years of education: 12   Highest education level: High school graduate  Occupational History    Comment: [employer]  Tobacco Use   Smoking status: Never    Passive exposure: Never   Smokeless tobacco: Never  Vaping Use   Vaping Use: Never used  Substance and Sexual Activity   Alcohol use: Never   Drug use: Never   Sexual activity: Not Currently    Partners: Male    Birth control/protection: None  Other Topics Concern   Not on file  Social History Narrative   ** Merged History Encounter **       Lives with husband and their 2 children and his daughter from another relationship.   Social Determinants of Health   Financial Resource Strain: Medium Risk (02/27/2022)   Overall Financial Resource Strain (CARDIA)    Difficulty of Paying Living Expenses: Somewhat hard  Food Insecurity: No Food Insecurity (02/27/2022)   Hunger Vital Sign    Worried About Running Out of Food in the Last Year: Never true    Ran Out of Food in the Last Year: Never true  Transportation Needs: No Transportation Needs (02/27/2022)   PRAPARE - Administrator, Civil Service (Medical): No    Lack of Transportation (Non-Medical): No  Physical Activity: Not on file  Stress: Not on file  Social  Connections: Not on file  Intimate Partner Violence: Not At Risk (02/27/2022)   Humiliation, Afraid, Rape, and Kick questionnaire    Fear of Current or Ex-Partner: No    Emotionally Abused: No    Physically Abused: No    Sexually Abused: No    Family History  Problem Relation Age of Onset   Diabetes Father      Review of Systems  Constitutional:  Negative for chills and fever.  HENT:  Negative for congestion, ear discharge, ear pain, hearing loss, sinus pain and sore throat.   Eyes:  Negative for blurred vision and double vision.  Respiratory:  Negative for  cough, shortness of breath and wheezing.   Cardiovascular:  Negative for chest pain, palpitations and leg swelling.  Gastrointestinal:  Negative for abdominal pain, blood in stool, constipation, diarrhea, heartburn, melena, nausea and vomiting.  Genitourinary:  Negative for dysuria, flank pain, frequency, hematuria and urgency.  Musculoskeletal:  Negative for back pain, joint pain and myalgias.  Skin:  Negative for itching and rash.  Neurological:  Negative for dizziness, tingling, tremors, sensory change, speech change, focal weakness, seizures, loss of consciousness, weakness and headaches.  Endo/Heme/Allergies:  Negative for environmental allergies. Does not bruise/bleed easily.  Psychiatric/Behavioral:  Negative for depression, hallucinations, memory loss, substance abuse and suicidal ideas. The patient is not nervous/anxious and does not have insomnia.      Physical Exam: BP 108/66 (BP Location: Left Arm)   Pulse 80   Temp (!) 97.5 F (36.4 C) (Oral)   Resp 20   LMP 12/12/2021 (Exact Date)   Constitutional: Well nourished, well developed female in no acute distress.  HEENT: normal Skin: Warm and dry.  Cardiovascular: Regular rate and rhythm.   Extremity:  no edema   Respiratory: Clear to auscultation bilateral. Normal respiratory effort Abdomen: FHT present Psych: Alert and Oriented x3. No memory deficits. Normal mood and affect.   Toco: negative Fetal well being: 150 bpm, moderate variability, +accelerations, -decelerations  Consults: None  Significant Findings/ Diagnostic Studies: labs:   Latest Reference Range & Units 08/27/22 15:51 08/27/22 16:46 08/27/22 16:50  WBC 4.0 - 10.5 K/uL  6.4   RBC 3.87 - 5.11 MIL/uL  3.73 (L)   Hemoglobin 12.0 - 15.0 g/dL  9.8 (L)   HCT 16.1 - 09.6 %  30.6 (L)   MCV 80.0 - 100.0 fL  82.0   MCH 26.0 - 34.0 pg  26.3   MCHC 30.0 - 36.0 g/dL  04.5   RDW 40.9 - 81.1 %  15.6 (H)   Platelets 150 - 400 K/uL  166   nRBC 0.0 - 0.2 %  0.0    Neutrophils %  59   Lymphocytes %  30   Monocytes Relative %  8   Eosinophil %  1   Basophil %  1   Immature Granulocytes %  1   NEUT# 1.7 - 7.7 K/uL  3.8   Lymphocyte # 0.7 - 4.0 K/uL  1.9   Monocyte # 0.1 - 1.0 K/uL  0.5   Eosinophils Absolute 0.0 - 0.5 K/uL  0.1   Basophils Absolute 0.0 - 0.1 K/uL  0.0   Abs Immature Granulocytes 0.00 - 0.07 K/uL  0.03   Sample Expiration   08/30/2022,2359 Performed at [facility], [address], [place], [place] [address]    Antibody Screen   NEG   ABO/RH(D)   A POS A POS Performed at [facility], [address] [place], [place]  [address]   Appearance CLEAR  HAZY !    Bilirubin Urine NEGATIVE  NEGATIVE    Color, Urine YELLOW  YELLOW !    Glucose, UA NEGATIVE mg/dL NEGATIVE    Hgb urine dipstick NEGATIVE  NEGATIVE    Ketones, ur NEGATIVE mg/dL NEGATIVE    Leukocytes,Ua NEGATIVE  NEGATIVE    Nitrite NEGATIVE  NEGATIVE    pH 5.0 - 8.0  7.0    Protein NEGATIVE mg/dL NEGATIVE    Specific Gravity, Urine 1.005 - 1.030  1.010    Bacteria, UA NONE SEEN  MANY !    Mucus  PRESENT    RBC / HPF 0 - 5 RBC/hpf 0-5    Squamous Epithelial / HPF 0 - 5 /HPF 0-5    WBC, UA 0 - 5 WBC/hpf 11-20    (L): Data is abnormally low (H): Data is abnormally high !: Data is abnormal  Procedures: NST  Hospital Course: The patient was admitted to Labor and Delivery Triage for observation.   Discharge Condition: good  Disposition: Discharge disposition: 01-Home or Self Care  Diet: Regular diet  Discharge Activity: Activity as tolerated  Discharge Instructions     Discharge activity:  No Restrictions   Complete by: As directed    Discharge diet:  No restrictions   Complete by: As directed    Stay well hydrated      Allergies as of 08/27/2022   No Known Allergies      Medication List     STOP taking these medications    acetaminophen 500 MG tablet Commonly known as: TYLENOL   prednisoLONE acetate 1 % ophthalmic  suspension Commonly known as: PRED FORTE       TAKE these medications    aspirin EC 81 MG tablet Take 1 tablet (81 mg total) by mouth daily. Swallow whole.   Prenatal Vitamin 27-0.8 MG Tabs Take 1 tablet by mouth daily.         Total time spent taking care of this patient: 38 minutes  Signed: Tresea Mall, CNM  08/27/2022, 9:57 PM

## 2022-08-27 NOTE — Progress Notes (Signed)
[facility]  PRENATAL VISIT NOTE  Subjective:  Lori Richards is a 37 y.o. [phone] at [redacted]w[redacted]d being seen today for ongoing prenatal care.  She is currently monitored for the following issues for this high-risk pregnancy and has Mooren's corneal ulcer, right eye; Supervision of other high risk pregnancy, antepartum; Antepartum multigravida of advanced maternal age 59; Obesity affecting pregnancy, antepartum; H/O cesarean section x2 classical incisions; Uterine fibroid 3.4x3.3x3.2 cm on 03/18/22 u/s; left benign ovarian cyst 2.7x2.6x2.6 cm on 02/06/22 u/s; and Polyhydramnios affecting pregnancy- resolved 4/8 on their problem list.  Patient reports contractions since 08/25/22 evening .  Contractions: Regular. Vag. Bleeding: None.  Movement: Present. Denies leaking of fluid/ROM.   The following portions of the patient's history were reviewed and updated as appropriate: allergies, current medications, past family history, past medical history, past social history, past surgical history and problem list. Problem list updated.  Objective:   Vitals:   08/27/22 1343  BP: 111/76  Pulse: 97  Temp: (!) 97.1 F (36.2 C)    Fetal Status: Fetal Heart Rate (bpm): 155 Fundal Height: 38 cm Movement: Present  Presentation: Vertex  General:  Alert, oriented and cooperative. Patient is in no acute distress.  Skin: Skin is warm and dry. No rash noted.   Cardiovascular: Normal heart rate noted  Respiratory: Normal respiratory effort, no problems with respiration noted  Abdomen: Soft, gravid, appropriate for gestational age.  Pain/Pressure: Present     Pelvic: Cervical exam performed Dilation: Fingertip Effacement (%): Thick Station: -3  Extremities: Normal range of motion.  Edema: None  Mental Status: Normal mood and affect. Normal behavior. Normal judgment and thought content.   Assessment and Plan:  Pregnancy: [id number] at [redacted]w[redacted]d  1. H/O cesarean section  x2 classical incisions IOL scheduled 08/29/22  2. Supervision of other high risk pregnancy, antepartum Pt presents crying c/o labor since 08/25/22, intact, no bloody show, back labor. Initially pt wanted to go home but CNM insisted on pt going to L&D for evaluation. Pt in extreme pain from back labor with u/c's q 3-4 min apart mild-mod. Pt arrived via transportation [place] and concern Zenaida Niece would refuse to take her to L&D so ambulance called. Right before ambulance arrived, pt crying and writhing in pain c/o pain in lower abdomen and back. Repeat c/s scheduled for 08/29/22 T/c to charge nurse on L&D [place] and report given to Carie Caddy, CNM Pt to L&D via wheelchair now; ambulance arrived and transported pt to L&D  3. Uterine leiomyoma, unspecified location   4. left benign ovarian cyst 2.7x2.6x2.6 cm on 02/06/22 u/s   5. Obesity affecting pregnancy, antepartum, unspecified obesity type 9 lb 10.5 oz (4.379 kg)   6. Antepartum multigravida of advanced maternal age 80    Preterm labor symptoms and general obstetric precautions including but not limited to vaginal bleeding, contractions, leaking of fluid and fetal movement were reviewed in detail with the patient. Please refer to After Visit Summary for other counseling recommendations.  No follow-ups on file.  Future Appointments  Date Time Provider Department Center  09/05/2022  9:35 AM Logan Bores Ellsworth Lennox, MD [facility] None    Alberteen Spindle, CNM

## 2022-08-27 NOTE — Progress Notes (Signed)
Interpretor Ipad usedMerlyn Albert [phone]

## 2022-08-27 NOTE — OB Triage Note (Signed)
Patient is a [id number] at [redacted]w[redacted]d who presents to unit c/o ctx all throughout the week. Reports +FM, denies LOF and bleeding. External monitors applied and assessing. Initial FHT 140. Vital signs WDL.

## 2022-08-29 ENCOUNTER — Other Ambulatory Visit: Payer: Self-pay

## 2022-08-29 ENCOUNTER — Inpatient Hospital Stay: Payer: Medicaid Other | Admitting: Urgent Care

## 2022-08-29 ENCOUNTER — Inpatient Hospital Stay: Payer: Medicaid Other | Admitting: Certified Registered Nurse Anesthetist

## 2022-08-29 ENCOUNTER — Encounter: Payer: Self-pay | Admitting: Obstetrics and Gynecology

## 2022-08-29 ENCOUNTER — Inpatient Hospital Stay
Admission: RE | Admit: 2022-08-29 | Discharge: 2022-08-31 | DRG: 788 | Disposition: A | Discharge status: Home / Self Care | Payer: Medicaid Other | Source: Ambulatory Visit | Attending: Obstetrics and Gynecology | Admitting: Obstetrics and Gynecology

## 2022-08-29 ENCOUNTER — Encounter
Admission: RE | Disposition: A | Discharge status: Home / Self Care | Payer: Self-pay | Source: Ambulatory Visit | Attending: Obstetrics and Gynecology

## 2022-08-29 DIAGNOSIS — D259 Leiomyoma of uterus, unspecified: Secondary | ICD-10-CM

## 2022-08-29 DIAGNOSIS — O34211 Maternal care for low transverse scar from previous cesarean delivery: Principal | ICD-10-CM

## 2022-08-29 DIAGNOSIS — Z3A37 37 weeks gestation of pregnancy: Secondary | ICD-10-CM | POA: Diagnosis not present

## 2022-08-29 DIAGNOSIS — O09523 Supervision of elderly multigravida, third trimester: Secondary | ICD-10-CM | POA: Diagnosis not present

## 2022-08-29 DIAGNOSIS — Z98891 History of uterine scar from previous surgery: Secondary | ICD-10-CM

## 2022-08-29 DIAGNOSIS — O3413 Maternal care for benign tumor of corpus uteri, third trimester: Secondary | ICD-10-CM | POA: Diagnosis present

## 2022-08-29 DIAGNOSIS — Z01818 Encounter for other preprocedural examination: Secondary | ICD-10-CM

## 2022-08-29 DIAGNOSIS — Z3490 Encounter for supervision of normal pregnancy, unspecified, unspecified trimester: Secondary | ICD-10-CM

## 2022-08-29 LAB — CBC
HCT: 32.7 % — ABNORMAL LOW (ref 36.0–46.0)
Hemoglobin: 10.4 g/dL — ABNORMAL LOW (ref 12.0–15.0)
MCH: 26 pg (ref 26.0–34.0)
MCHC: 31.8 g/dL (ref 30.0–36.0)
MCV: 81.8 fL (ref 80.0–100.0)
Platelets: 153 10*3/uL (ref 150–400)
RBC: 4 MIL/uL (ref 3.87–5.11)
RDW: 15.5 % (ref 11.5–15.5)
WBC: 6.6 10*3/uL (ref 4.0–10.5)
nRBC: 0 % (ref 0.0–0.2)

## 2022-08-29 LAB — TYPE AND SCREEN
ABO/RH(D): A POS
Antibody Screen: NEGATIVE

## 2022-08-29 LAB — RPR
RPR Ser Ql: NONREACTIVE
RPR Ser Ql: NONREACTIVE

## 2022-08-29 SURGERY — Surgical Case
Anesthesia: Spinal

## 2022-08-29 MED ORDER — NALOXONE HCL 4 MG/10ML IJ SOLN: 1.0000 ug/kg/h | INTRAVENOUS | Status: DC | PRN

## 2022-08-29 MED ORDER — DIPHENHYDRAMINE HCL 50 MG/ML IJ SOLN: 12.5000 mg | INTRAMUSCULAR | Status: DC | PRN

## 2022-08-29 MED ORDER — KETOROLAC TROMETHAMINE 30 MG/ML IJ SOLN: 30.0000 mg | Freq: Four times a day (QID) | INTRAMUSCULAR | Status: AC | PRN

## 2022-08-29 MED ORDER — PROMETHAZINE HCL 25 MG/ML IJ SOLN: 6.2500 mg | INTRAMUSCULAR | Status: DC | PRN

## 2022-08-29 MED ORDER — TRANEXAMIC ACID-NACL 1000-0.7 MG/100ML-% IV SOLN
INTRAVENOUS | Status: AC
  Filled 2022-08-29: qty 100

## 2022-08-29 MED ORDER — EPHEDRINE 5 MG/ML INJ
INTRAVENOUS | Status: AC
  Filled 2022-08-29: qty 5

## 2022-08-29 MED ORDER — METHYLERGONOVINE MALEATE 0.2 MG/ML IJ SOLN
INTRAMUSCULAR | Status: AC
  Filled 2022-08-29: qty 1

## 2022-08-29 MED ORDER — PRENATAL MULTIVITAMIN CH
1.0000 | ORAL_TABLET | Freq: Every day | ORAL | Status: DC
  Administered 2022-08-30 – 2022-08-31 (×2): 1 via ORAL
  Filled 2022-08-29 (×3): qty 1

## 2022-08-29 MED ORDER — OXYTOCIN-SODIUM CHLORIDE 30-0.9 UT/500ML-% IV SOLN
INTRAVENOUS | Status: AC
  Filled 2022-08-29: qty 500

## 2022-08-29 MED ORDER — MENTHOL 3 MG MT LOZG: 1.0000 | LOZENGE | OROMUCOSAL | Status: DC | PRN

## 2022-08-29 MED ORDER — PHENYLEPHRINE HCL-NACL 20-0.9 MG/250ML-% IV SOLN
INTRAVENOUS | Status: AC
  Filled 2022-08-29: qty 250

## 2022-08-29 MED ORDER — ONDANSETRON HCL 4 MG/2ML IJ SOLN
INTRAMUSCULAR | Status: AC
  Filled 2022-08-29: qty 2

## 2022-08-29 MED ORDER — BUPIVACAINE IN DEXTROSE 0.75-8.25 % IT SOLN
INTRATHECAL | Status: DC | PRN
  Administered 2022-08-29: 1.4 mL via INTRATHECAL

## 2022-08-29 MED ORDER — LACTATED RINGERS IV BOLUS
1000.0000 mL | Freq: Once | INTRAVENOUS | Status: AC
  Administered 2022-08-29: 1000 mL via INTRAVENOUS

## 2022-08-29 MED ORDER — SIMETHICONE 80 MG PO CHEW
80.0000 mg | CHEWABLE_TABLET | Freq: Four times a day (QID) | ORAL | Status: DC
  Administered 2022-08-29 – 2022-08-31 (×7): 80 mg via ORAL
  Filled 2022-08-29 (×7): qty 1

## 2022-08-29 MED ORDER — SENNOSIDES-DOCUSATE SODIUM 8.6-50 MG PO TABS
2.0000 | ORAL_TABLET | ORAL | Status: DC
  Administered 2022-08-30 – 2022-08-31 (×2): 2 via ORAL
  Filled 2022-08-29 (×3): qty 2

## 2022-08-29 MED ORDER — DEXAMETHASONE SODIUM PHOSPHATE 10 MG/ML IJ SOLN
INTRAMUSCULAR | Status: AC
  Filled 2022-08-29: qty 1

## 2022-08-29 MED ORDER — MISOPROSTOL 200 MCG PO TABS
ORAL_TABLET | ORAL | Status: AC
  Filled 2022-08-29: qty 4

## 2022-08-29 MED ORDER — OXYCODONE HCL 5 MG PO TABS
5.0000 mg | ORAL_TABLET | Freq: Four times a day (QID) | ORAL | Status: DC | PRN
  Administered 2022-08-29: 5 mg via ORAL
  Filled 2022-08-29: qty 1

## 2022-08-29 MED ORDER — OXYTOCIN 10 UNIT/ML IJ SOLN
INTRAMUSCULAR | Status: AC
  Filled 2022-08-29: qty 2

## 2022-08-29 MED ORDER — LIDOCAINE 5 % EX PTCH
MEDICATED_PATCH | CUTANEOUS | Status: AC
  Filled 2022-08-29: qty 1

## 2022-08-29 MED ORDER — CARBOPROST TROMETHAMINE 250 MCG/ML IM SOLN
INTRAMUSCULAR | Status: AC
  Filled 2022-08-29: qty 1

## 2022-08-29 MED ORDER — LIDOCAINE HCL (PF) 1 % IJ SOLN
INTRAMUSCULAR | Status: AC
  Filled 2022-08-29: qty 30

## 2022-08-29 MED ORDER — OXYTOCIN-SODIUM CHLORIDE 30-0.9 UT/500ML-% IV SOLN
2.5000 [IU]/h | INTRAVENOUS | Status: AC
  Administered 2022-08-29: 2.5 [IU]/h via INTRAVENOUS

## 2022-08-29 MED ORDER — SOD CITRATE-CITRIC ACID 500-334 MG/5ML PO SOLN
ORAL | Status: AC
  Administered 2022-08-29: 30 mL
  Filled 2022-08-29: qty 15

## 2022-08-29 MED ORDER — LACTATED RINGERS IV SOLN: INTRAVENOUS | Status: DC

## 2022-08-29 MED ORDER — DIPHENHYDRAMINE HCL 25 MG PO CAPS: 25.0000 mg | ORAL_CAPSULE | Freq: Four times a day (QID) | ORAL | Status: DC | PRN

## 2022-08-29 MED ORDER — CEFAZOLIN SODIUM-DEXTROSE 2-4 GM/100ML-% IV SOLN
2.0000 g | INTRAVENOUS | Status: AC
  Administered 2022-08-29: 2 g via INTRAVENOUS
  Filled 2022-08-29: qty 100

## 2022-08-29 MED ORDER — EPHEDRINE SULFATE-NACL 50-0.9 MG/10ML-% IV SOSY
PREFILLED_SYRINGE | INTRAVENOUS | Status: DC | PRN
  Administered 2022-08-29 (×2): 5 mg via INTRAVENOUS

## 2022-08-29 MED ORDER — ONDANSETRON HCL 4 MG/2ML IJ SOLN: 4.0000 mg | Freq: Three times a day (TID) | INTRAMUSCULAR | Status: DC | PRN

## 2022-08-29 MED ORDER — PHENYLEPHRINE 80 MCG/ML (10ML) SYRINGE FOR IV PUSH (FOR BLOOD PRESSURE SUPPORT)
PREFILLED_SYRINGE | INTRAVENOUS | Status: AC
  Filled 2022-08-29: qty 10

## 2022-08-29 MED ORDER — NALOXONE HCL 0.4 MG/ML IJ SOLN: 0.4000 mg | INTRAMUSCULAR | Status: DC | PRN

## 2022-08-29 MED ORDER — FENTANYL CITRATE (PF) 100 MCG/2ML IJ SOLN
INTRAMUSCULAR | Status: AC
  Filled 2022-08-29: qty 2

## 2022-08-29 MED ORDER — POVIDONE-IODINE 10 % EX SWAB
2.0000 | Freq: Once | CUTANEOUS | Status: AC
  Administered 2022-08-29: 2 via TOPICAL

## 2022-08-29 MED ORDER — OXYTOCIN-SODIUM CHLORIDE 30-0.9 UT/500ML-% IV SOLN
INTRAVENOUS | Status: DC | PRN
  Administered 2022-08-29: 250 mL/h via INTRAVENOUS

## 2022-08-29 MED ORDER — LIDOCAINE HCL (PF) 1 % IJ SOLN
INTRAMUSCULAR | Status: DC | PRN
  Administered 2022-08-29: 3 mL via SUBCUTANEOUS

## 2022-08-29 MED ORDER — MORPHINE SULFATE (PF) 0.5 MG/ML IJ SOLN
INTRAMUSCULAR | Status: DC | PRN
  Administered 2022-08-29: .1 mg via INTRATHECAL

## 2022-08-29 MED ORDER — KETOROLAC TROMETHAMINE 30 MG/ML IJ SOLN
INTRAMUSCULAR | Status: DC | PRN
  Administered 2022-08-29: 30 mg via INTRAVENOUS

## 2022-08-29 MED ORDER — IBUPROFEN 600 MG PO TABS
600.0000 mg | ORAL_TABLET | Freq: Four times a day (QID) | ORAL | Status: DC
  Administered 2022-08-30 – 2022-08-31 (×7): 600 mg via ORAL
  Filled 2022-08-29 (×7): qty 1

## 2022-08-29 MED ORDER — PHENYLEPHRINE HCL-NACL 20-0.9 MG/250ML-% IV SOLN
INTRAVENOUS | Status: DC | PRN
  Administered 2022-08-29: 50 ug/min via INTRAVENOUS

## 2022-08-29 MED ORDER — DEXAMETHASONE SODIUM PHOSPHATE 10 MG/ML IJ SOLN
INTRAMUSCULAR | Status: DC | PRN
  Administered 2022-08-29: 10 mg via INTRAVENOUS

## 2022-08-29 MED ORDER — FENTANYL CITRATE (PF) 100 MCG/2ML IJ SOLN
INTRAMUSCULAR | Status: DC | PRN
  Administered 2022-08-29: 15 ug via INTRATHECAL

## 2022-08-29 MED ORDER — DIPHENHYDRAMINE HCL 25 MG PO CAPS: 25.0000 mg | ORAL_CAPSULE | ORAL | Status: DC | PRN

## 2022-08-29 MED ORDER — FENTANYL CITRATE (PF) 100 MCG/2ML IJ SOLN: 25.0000 ug | INTRAMUSCULAR | Status: DC | PRN

## 2022-08-29 MED ORDER — KETOROLAC TROMETHAMINE 30 MG/ML IJ SOLN
INTRAMUSCULAR | Status: AC
  Filled 2022-08-29: qty 1

## 2022-08-29 MED ORDER — MORPHINE SULFATE (PF) 0.5 MG/ML IJ SOLN
INTRAMUSCULAR | Status: AC
  Filled 2022-08-29: qty 10

## 2022-08-29 MED ORDER — GLYCOPYRROLATE 0.2 MG/ML IJ SOLN
INTRAMUSCULAR | Status: AC
  Filled 2022-08-29: qty 1

## 2022-08-29 MED ORDER — OXYCODONE-ACETAMINOPHEN 5-325 MG PO TABS
1.0000 | ORAL_TABLET | ORAL | Status: DC | PRN
  Administered 2022-08-30 – 2022-08-31 (×3): 1 via ORAL
  Filled 2022-08-29 (×3): qty 1

## 2022-08-29 MED ORDER — AMMONIA AROMATIC IN INHA
RESPIRATORY_TRACT | Status: AC
  Filled 2022-08-29: qty 10

## 2022-08-29 MED ORDER — MEPERIDINE HCL 25 MG/ML IJ SOLN: 6.2500 mg | INTRAMUSCULAR | Status: DC | PRN

## 2022-08-29 MED ORDER — SODIUM CHLORIDE 0.9% FLUSH: 3.0000 mL | INTRAVENOUS | Status: DC | PRN

## 2022-08-29 MED ORDER — KETOROLAC TROMETHAMINE 30 MG/ML IJ SOLN
30.0000 mg | Freq: Four times a day (QID) | INTRAMUSCULAR | Status: AC | PRN
  Administered 2022-08-29: 30 mg via INTRAVENOUS
  Filled 2022-08-29: qty 1

## 2022-08-29 MED ORDER — ZOLPIDEM TARTRATE 5 MG PO TABS: 5.0000 mg | ORAL_TABLET | Freq: Every evening | ORAL | Status: DC | PRN

## 2022-08-29 MED ORDER — ONDANSETRON HCL 4 MG/2ML IJ SOLN
INTRAMUSCULAR | Status: DC | PRN
  Administered 2022-08-29: 4 mg via INTRAVENOUS

## 2022-08-29 SURGICAL SUPPLY — 29 items
ADH LQ OCL WTPRF AMP STRL LF (MISCELLANEOUS) ×1
ADHESIVE MASTISOL STRL (MISCELLANEOUS) ×1 IMPLANT
APL PRP STRL LF DISP 70% ISPRP (MISCELLANEOUS) ×2
BAG COUNTER SPONGE SURGICOUNT (BAG) ×1 IMPLANT
BAG SPNG CNTER NS LX DISP (BAG) ×1
CHLORAPREP W/TINT 26 (MISCELLANEOUS) ×2 IMPLANT
CLIP FILSHIE TUBAL LIGA STRL (Clip) IMPLANT
DRSG TELFA 3X8 NADH STRL (GAUZE/BANDAGES/DRESSINGS) ×1 IMPLANT
GAUZE SPONGE 4X4 12PLY STRL (GAUZE/BANDAGES/DRESSINGS) ×1 IMPLANT
GLOVE PI ORTHO PRO STRL 7.5 (GLOVE) ×1 IMPLANT
GOWN STRL REUS W/ TWL LRG LVL3 (GOWN DISPOSABLE) ×2 IMPLANT
GOWN STRL REUS W/TWL LRG LVL3 (GOWN DISPOSABLE) ×2
KIT TURNOVER KIT A (KITS) ×1 IMPLANT
MANIFOLD NEPTUNE II (INSTRUMENTS) ×1 IMPLANT
MAT PREVALON FULL STRYKER (MISCELLANEOUS) ×1 IMPLANT
NS IRRIG 1000ML POUR BTL (IV SOLUTION) ×1 IMPLANT
PACK C SECTION AR (MISCELLANEOUS) ×1 IMPLANT
PAD OB MATERNITY 4.3X12.25 (PERSONAL CARE ITEMS) ×1 IMPLANT
PAD PREP OB/GYN DISP 24X41 (PERSONAL CARE ITEMS) ×1 IMPLANT
RETRACTOR WND ALEXIS-O 25 LRG (MISCELLANEOUS) ×1 IMPLANT
RTRCTR WOUND ALEXIS O 25CM LRG (MISCELLANEOUS) ×1
SCRUB CHG 4% DYNA-HEX 4OZ (MISCELLANEOUS) ×1 IMPLANT
SPONGE T-LAP 18X18 ~~LOC~~+RFID (SPONGE) ×1 IMPLANT
SUT VIC AB 0 CTX 36 (SUTURE) ×2
SUT VIC AB 0 CTX36XBRD ANBCTRL (SUTURE) ×2 IMPLANT
SUT VIC AB 1 CT1 36 (SUTURE) ×2 IMPLANT
SUT VICRYL+ 3-0 36IN CT-1 (SUTURE) ×2 IMPLANT
TRAP FLUID SMOKE EVACUATOR (MISCELLANEOUS) ×1 IMPLANT
WATER STERILE IRR 500ML POUR (IV SOLUTION) ×1 IMPLANT

## 2022-08-29 NOTE — Op Note (Addendum)
     OP NOTE  Date: 08/29/2022   9:07 AM Name Lori Richards MR# [id number]  Preoperative Diagnosis: 1. Intrauterine pregnancy at [redacted]w[redacted]d 2. Possible previous vertical incision (unknown) 3. Uterine fibroids  Postoperative Diagnosis: 1. Intrauterine pregnancy at [redacted]w[redacted]d, delivered 2. Viable infant 3. Remainder same as pre-op   Procedure: 1. Repeat Low-Transverse Cesarean Section  Surgeon: Elonda Husky, MD  Assistant:  Valentino Saxon MD    No other capable assistant was available for this surgery which requires an experienced, high level assistant.  Anesthesia: Spinal    EBL: 600 ml     Findings: 1) female infant, Apgar scores of 9   at 1 minute and 9   at 5 minutes and a birthweight of 114.29  ounces.    2) Normal uterus, tubes and ovaries.    Procedure:  The patient was prepped and draped in the supine position and placed under spinal anesthesia.  A transverse incision was made across the abdomen in a Pfannenstiel manner. If indicated the old scar was systematically removed with sharp dissection.  We carried the dissection down to the level of the fascia.  The fascia was incised in a curvilinear manner.  The fascia was then elevated from the rectus muscles with blunt and sharp dissection.  The rectus muscles were separated laterally exposing the peritoneum.  The peritoneum was carefully entered with care being taken to avoid bowel and bladder.  Multiple adhesions of the uterus to the abdominal wall were noted.  These were systematically lysed enough to provide exposure to the lower uterine segment. A self-retaining retractor was placed. A transverse incision was made across the lower uterine segment and extended laterally and superiorly using blunt dissection.  Artificial rupture membranes was performed and Clear fluid was noted.  The infant was delivered from the cephalic position.  A nuchal cord was present. After an appropriate time interval, the cord was doubly clamped and cut.  Cord blood was obtained if required.  The infant was handed to the pediatric personnel  who then placed the infant under heat lamps where it was cleaned dried and suctioned as needed. The placenta was delivered. The hysterotomy incision was then identified on ring forceps.  The uterine cavity was cleaned with a moist lap sponge.  The hysterotomy incision was closed with a running interlocking suture of Vicryl.  Hemostasis was excellent.  Pitocin was run in the IV and the uterus was found to be firm. The posterior cul-de-sac and gutters were cleaned and inspected.  Hemostasis was noted.  The fascia was then closed with a running suture of #1 Vicryl.  Hemostasis of the subcutaneous tissues was obtained using the Bovie.  The subcutaneous tissues were closed with a running suture of 000 Vicryl.  A subcuticular suture was placed.  Steri-strips were applied in the usual manner.  A Lidoderm patch was applied.  A pressure dressing was placed.  The patient went to the recovery room in stable condition. Dr. Valentino Saxon provided exposure, dissection, suctioning, retraction, and general support and assistance during the procedure.   Elonda Husky, M.D. 08/29/2022 9:07 AM

## 2022-08-29 NOTE — H&P (Signed)
History and Physical   HPI  Lori Richards is a 37 y.o. [id number] at [redacted]w[redacted]d Estimated Date of Delivery: 09/18/22 who is being admitted for repeat CD X 3.  H/O delivery in [place] with unknown uterine scar. Pregnancy at term.  Pt also known to have multiple uterine fibroids.  Discussed at length and pt desires transverse skin and uterine incision if possible despite previous vertical abd incision.   OB History  OB History  Gravida Para Term Preterm AB Living  4 2 2  0 1 2  SAB IAB Ectopic Multiple Live Births  1 0 0 0 2    # Outcome Date GA Lbr Len/2nd Weight Sex Delivery Anes PTL Lv  4 Current           3 Term 06/02/19    F CS-Unspec   LIV  2 Term 04/09/18    M CS-Unspec   LIV  1 SAB 2016            PROBLEM LIST  Pregnancy complications or risks: Patient Active Problem List   Diagnosis Date Noted   Labor and delivery indication for care or intervention 08/27/2022   [redacted] weeks gestation of pregnancy 08/27/2022   Polyhydramnios affecting pregnancy- resolved 4/8 06/05/2022   Uterine fibroid 3.4x3.3x3.2 cm on 03/18/22 u/s 03/24/2022   left benign ovarian cyst 2.7x2.6x2.6 cm on 02/06/22 u/s 03/24/2022   Supervision of other high risk pregnancy, antepartum 02/27/2022   Antepartum multigravida of advanced maternal age 75 02/27/2022   Obesity affecting pregnancy, antepartum 02/27/2022   H/O cesarean section x2 classical incisions 02/27/2022   Mooren's corneal ulcer, 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    Prenatal labs and studies: ABO, Rh: --/--/A POS (05/03 0503) Antibody: NEG (05/03 0503) Rubella: 19.90 (11/02 1551) RPR: Non Reactive (02/29 1422)  HBsAg: Negative (11/02 1551)  HIV: Non Reactive (11/02 1551)  YNW:GNFAOZHY/-- (04/25 1527)   Past Medical History:  Diagnosis Date   Mooren's corneal ulcer, right eye    Per IOM Documents   Uterine fibroid 3.4x3.3x3.2 cm on 03/18/22 u/s 03/24/2022     Past Surgical History:  Procedure Laterality Date   CESAREAN SECTION     C  Section 2019 and 2021   EYE SURGERY       Medications    Current Discharge Medication List     CONTINUE these medications which have NOT CHANGED   Details  aspirin EC 81 MG tablet Take 1 tablet (81 mg total) by mouth daily. Swallow whole.   Associated Diagnoses: Antepartum multigravida of advanced maternal age; Obesity affecting pregnancy, antepartum, unspecified obesity type    Prenatal Vit-Fe Fumarate-FA (PRENATAL VITAMIN) 27-0.8 MG TABS Take 1 tablet by mouth daily. Qty: 100 tablet, Refills: 0   Associated Diagnoses: Supervision of normal first pregnancy, antepartum         Allergies  Patient has no known allergies.  Review of Systems  Pertinent items noted in HPI and remainder of comprehensive ROS otherwise negative.  Physical Exam  BP 120/75 (BP Location: Right Arm)   Pulse 65   Temp 97.9 F (36.6 C) (Oral)   Resp 18   LMP 12/12/2021 (Exact Date)   Lungs:  CTA B Cardio: RRR without M/R/G Abd: Soft, gravid, NT Presentation: cephalic EXT: No C/C/ 1+ Edema DTRs: 2+ B CERVIX:    See Prenatal records for more detailed PE.     FHR:  Variability: Good {> 6 bpm)  Toco: Uterine Contractions: Q 5-8 min becoming stronger  Test Results  Results  for orders placed or performed during the hospital encounter of 08/29/22 (from the past 24 hour(s))  CBC     Status: Abnormal   Collection Time: 08/29/22  5:03 AM  Result Value Ref Range   WBC 6.6 4.0 - 10.5 K/uL   RBC 4.00 3.87 - 5.11 MIL/uL   Hemoglobin 10.4 (L) 12.0 - 15.0 g/dL   HCT 16.1 (L) 09.6 - 04.5 %   MCV 81.8 80.0 - 100.0 fL   MCH 26.0 26.0 - 34.0 pg   MCHC 31.8 30.0 - 36.0 g/dL   RDW 40.9 81.1 - 91.4 %   Platelets 153 150 - 400 K/uL   nRBC 0.0 0.0 - 0.2 %  Type and screen     Status: None   Collection Time: 08/29/22  5:03 AM  Result Value Ref Range   ABO/RH(D) A POS    Antibody Screen NEG    Sample Expiration      09/01/2022,2359 Performed at [facility], [address]  [place], [place] [address]     Assessment   [id number] at [redacted]w[redacted]d Estimated Date of Delivery: 09/18/22  The fetus is reassuring.   Patient Active Problem List   Diagnosis Date Noted   Labor and delivery indication for care or intervention 08/27/2022   [redacted] weeks gestation of pregnancy 08/27/2022   Polyhydramnios affecting pregnancy- resolved 4/8 06/05/2022   Uterine fibroid 3.4x3.3x3.2 cm on 03/18/22 u/s 03/24/2022   left benign ovarian cyst 2.7x2.6x2.6 cm on 02/06/22 u/s 03/24/2022   Supervision of other high risk pregnancy, antepartum 02/27/2022   Antepartum multigravida of advanced maternal age 28 02/27/2022   Obesity affecting pregnancy, antepartum 02/27/2022   H/O cesarean section x2 classical incisions 02/27/2022   Mooren's corneal ulcer, 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 Admit to L&D :   2. EFM: -- Category 1 3. Repeat CD  Elonda Husky, M.D. 08/29/2022 7:37 AM

## 2022-08-29 NOTE — Lactation Note (Signed)
This note was copied from a baby's chart. Lactation Consultation Note  Patient Name: Lori Richards [id number] Date: 08/29/2022 Age:35 hours   Reason for consult: L&D Initial assessment; Early term 28-38.6wks; RN request L&D Initial assessment; Early term 56-38.6wks; RN request   Maternal Data  This is mom's 3rd baby, repeat C/S. Mom with history of obesity, AMA, and Mooren's corneal ulcer right eye. Mom is an experienced breastfeeding mother. She breastfed 1 child for 7 months and 1 child for 2 years. Per mom she would have breastfed longer if her 31 months old would have. Mom reports to care nurse she does not have a pump for home use and she would like one.   Feeding Choice on Admission: Breast Milk and Formula   Provided mom with tips and strategies to maximize position and latch technique.  LATCH Score=8   Latch :2-  Grasps breast easily, tongue down, lips flanged, rhythmical sucking.  Audible swallowing::1 - A few with stimulation  Type of nipple :2 Everted at rest Comfort of Breast/nipple: 2 soft non tender Hold(positioning):1-Assistance needed to correctly position infant at breast and maintain latch.     Lactation Tools Discussed/Used  Harmony manual pump  Interventions  Breastfeeding basics, education, assisted with latch.  Consult Status: Follow-up from L&D   All dialogue with patient with interpreter Update provided to care nurse.  Fuller Song 08/29/2022, 3:49 PM

## 2022-08-29 NOTE — Transfer of Care (Signed)
Immediate Anesthesia Transfer of Care Note  Patient: Lori Richards  Procedure(s) Performed: REPEAT CESAREAN DELIVERY  Patient Location: Mother/Baby  Anesthesia Type:Spinal  Level of Consciousness: awake, alert , and oriented  Airway & Oxygen Therapy: Patient Spontanous Breathing  Post-op Assessment: Report given to RN and Post -op Vital signs reviewed and stable  Post vital signs: Reviewed and stable  Last Vitals:  Vitals Value Taken Time  BP 108/66   Temp    Pulse 67   Resp 14   SpO2 100     Last Pain:  Vitals:   08/29/22 0704  TempSrc: Oral         Complications: No notable events documented.

## 2022-08-29 NOTE — Anesthesia Preprocedure Evaluation (Signed)
Anesthesia Evaluation  Patient identified by MRN, date of birth, ID band Patient awake    Reviewed: Allergy & Precautions, H&P , NPO status , Patient's Chart, lab work & pertinent test results, reviewed documented beta blocker date and time   History of Anesthesia Complications Negative for: history of anesthetic complications  Airway Mallampati: III  TM Distance: >3 FB Neck ROM: full    Dental  (+) Dental Advidsory Given   Pulmonary neg pulmonary ROS   Pulmonary exam normal breath sounds clear to auscultation       Cardiovascular Exercise Tolerance: Good negative cardio ROS Normal cardiovascular exam Rhythm:regular Rate:Normal     Neuro/Psych negative neurological ROS  negative psych ROS   GI/Hepatic negative GI ROS, Neg liver ROS,,,  Endo/Other  negative endocrine ROS    Renal/GU negative Renal ROS  negative genitourinary   Musculoskeletal   Abdominal   Peds  Hematology negative hematology ROS (+)   Anesthesia Other Findings Past Medical History: No date: Mooren's corneal ulcer, right eye     Comment:  Per IOM Documents 03/24/2022: Uterine fibroid 3.4x3.3x3.2 cm on 03/18/22 u/s   Reproductive/Obstetrics negative OB ROS                             Anesthesia Physical Anesthesia Plan  ASA: 2  Anesthesia Plan: Spinal   Post-op Pain Management:    Induction:   PONV Risk Score and Plan: 3  Airway Management Planned: Natural Airway and Simple Face Mask  Additional Equipment:   Intra-op Plan:   Post-operative Plan:   Informed Consent: I have reviewed the patients History and Physical, chart, labs and discussed the procedure including the risks, benefits and alternatives for the proposed anesthesia with the patient or authorized representative who has indicated his/her understanding and acceptance.     Dental Advisory Given  Plan Discussed with: Anesthesiologist, CRNA  and Surgeon  Anesthesia Plan Comments:        Anesthesia Quick Evaluation

## 2022-08-30 LAB — CBC
HCT: 26.6 % — ABNORMAL LOW (ref 36.0–46.0)
Hemoglobin: 8.4 g/dL — ABNORMAL LOW (ref 12.0–15.0)
MCH: 26 pg (ref 26.0–34.0)
MCHC: 31.6 g/dL (ref 30.0–36.0)
MCV: 82.4 fL (ref 80.0–100.0)
Platelets: 132 10*3/uL — ABNORMAL LOW (ref 150–400)
RBC: 3.23 MIL/uL — ABNORMAL LOW (ref 3.87–5.11)
RDW: 15.6 % — ABNORMAL HIGH (ref 11.5–15.5)
WBC: 8.1 10*3/uL (ref 4.0–10.5)
nRBC: 0 % (ref 0.0–0.2)

## 2022-08-30 LAB — TYPE AND SCREEN: ABO/RH(D): A POS

## 2022-08-30 NOTE — Anesthesia Post-op Follow-up Note (Signed)
  Anesthesia Pain Follow-up Note  Patient: Lori Richards  Day #: 1  Date of Follow-up: 08/30/2022 Time: 2:38 PM  Last Vitals:  Vitals:   08/30/22 0006 08/30/22 0811  BP: 110/75 105/70  Pulse: 78 79  Resp: 19 18  Temp: 36.7 C 36.6 C  SpO2: 97% 99%    Level of Consciousness: alert  Pain: mild   Side Effects:Pruritis  Catheter Site Exam:clean, dry     Plan: D/C from anesthesia care at surgeon's request  Lenard Simmer

## 2022-08-30 NOTE — Progress Notes (Signed)
Patient ID: Lori Richards, female   DOB: 1986-02-22, 37 y.o.   MRN: [id number]   Progress Note - Cesarean Delivery  Lori Richards is a 37 y.o. [id number] now 35 day 1 s/p C-Section, Low Transverse .   Subjective:  Patient reports no problems with eating, bowel movements, voiding, or their wound  Breast and bottle feeding  Pain controlled   OOB without problem  Objective:  Vital signs in last 24 hours: Temp:  [97.8 F (36.6 C)-98.1 F (36.7 C)] 97.8 F (36.6 C) (05/04 0811) Pulse Rate:  [71-87] 79 (05/04 0811) Resp:  [17-19] 18 (05/04 0811) BP: (103-113)/(65-75) 105/70 (05/04 0811) SpO2:  [96 %-99 %] 99 % (05/04 0811)  Physical Exam:  General: alert, cooperative, and no distress Lochia: appropriate Uterine Fundus: firm Incision: honeycomb in place    Data Review Recent Labs    08/29/22 0503 08/30/22 0609  HGB 10.4* 8.4*  HCT 32.7* 26.6*    Assessment:  Principal Problem:   Term pregnancy, repeat Active Problems:   History of 2 cesarean sections   Status post Cesarean section. Doing well postoperatively.     Plan:       Continue current care.  Probable discharge tomorrow  Plan FU in one week in the office  Elonda Husky, M.D. 08/30/2022 11:09 AM

## 2022-08-30 NOTE — Anesthesia Postprocedure Evaluation (Signed)
Anesthesia Post Note  Patient: Lori Richards  Procedure(s) Performed: REPEAT CESAREAN DELIVERY  Patient location during evaluation: Mother Baby Anesthesia Type: Spinal Level of consciousness: oriented and awake and alert Pain management: pain level controlled Vital Signs Assessment: post-procedure vital signs reviewed and stable Respiratory status: spontaneous breathing and respiratory function stable Cardiovascular status: blood pressure returned to baseline and stable Postop Assessment: no headache, no backache, no apparent nausea or vomiting, able to ambulate and patient able to bend at knees Anesthetic complications: no   No notable events documented.   Last Vitals:  Vitals:   08/30/22 0006 08/30/22 0811  BP: 110/75 105/70  Pulse: 78 79  Resp: 19 18  Temp: 36.7 C 36.6 C  SpO2: 97% 99%    Last Pain:  Vitals:   08/30/22 1141  TempSrc:   PainSc: 4                  Lenard Simmer

## 2022-08-30 NOTE — Lactation Note (Signed)
This note was copied from a baby's chart. Lactation Consultation Note  Patient Name: Lori Richards [id number] Date: 08/30/2022 Age:35 hours  Video interpreter in Swahili utilized   Maternal Data  Mom states she breastfed last child x 2 yrs  Feeding  Mom states she is attempting breastfeeding before she feeds formula, states she doesn't have enough milk and baby is fussy after breastfeeding so she feeds formula, Reviewed how milk is made and that frequent breast feedings assist in making more milk.  I did not observe a breastfeeding as baby had been formula fed 1 hr earlier.    LATCH Score                    Lactation Tools Discussed/Used    Interventions  LC name and no written on white board for mom to call for questions or assistance, the mom stated she had no questions per the video interpreter.    Discharge    Consult Status      Dyann Kief 08/30/2022, 2:46 PM

## 2022-08-31 MED ORDER — IBUPROFEN 600 MG PO TABS: 600.0000 mg | ORAL_TABLET | Freq: Four times a day (QID) | ORAL | 0 refills | Status: DC

## 2022-08-31 MED ORDER — OXYCODONE-ACETAMINOPHEN 5-325 MG PO TABS: 1.0000 | ORAL_TABLET | ORAL | 0 refills | Status: DC | PRN

## 2022-08-31 NOTE — Discharge Instructions (Addendum)
Call Dr Logan Bores office to schedule a follow up appointment for incision check after surgery.  Please contact [facility] for help with food stamps and WIC  [address] [place], [place] [address] [phone]  Contact your employer for questions about FMLA.  Take FMLA forms to your doctor's appointment and have them fill out the physician portion.  Take forms back to your employer human resources department.

## 2022-08-31 NOTE — Progress Notes (Signed)
Patient needed information on how to apply for food stamps and obtain FLMA. CSW attached the information to patients AVS.

## 2022-08-31 NOTE — Lactation Note (Signed)
This note was copied from a baby's chart. Lactation Consultation Note  Patient Name: Girl Valoria Slaydon [id number] Date: 08/31/2022 Age:35 hours Reason for consult: Follow-up assessment   Maternal Data    Feeding Mother's Current Feeding Choice: Breast Milk and Formula  LATCH Score Latch: Repeated attempts needed to sustain latch, nipple held in mouth throughout feeding, stimulation needed to elicit sucking reflex.  Audible Swallowing: A few with stimulation  Type of Nipple: Everted at rest and after stimulation  Comfort (Breast/Nipple): Engorged, cracked, bleeding, large blisters, severe discomfort  Hold (Positioning): Assistance needed to correctly position infant at breast and maintain latch.  LATCH Score: 5 Couplet visited by [facility] toward the end of the feeding. Feeding on right side in cradle hold in misalignment. Demonstrated ear/shoulder/hip alignment and repositioned baby. Some swallows heard and lips flanged at breast but nipple was wedged when baby detached. Wide mouth latch encouraged.   Both breasts leaking, sore, and engorged. Ice packs given to be used after each feeding.   Lactation Tools Discussed/Used    Interventions Interventions: Breast feeding basics reviewed;Assisted with latch;Adjust position;Support pillows;Position options;Ice  Discharge Discharge Education: Engorgement and breast care  Consult Status      Matt Holmes 08/31/2022, 3:36 PM

## 2022-08-31 NOTE — Discharge Summary (Signed)
Postpartum Discharge Summary  Date of Service updated 08/31/2022     Patient Name: Lori Richards DOB: 12/28/1985 MRN: [id number]  Date of admission: 08/29/2022 Delivery date:08/29/2022  Delivering provider: Linzie Collin  Date of discharge: 08/31/2022  Admitting diagnosis: Term pregnancy, repeat [Z34.90] Intrauterine pregnancy: [redacted]w[redacted]d     Secondary diagnosis:  Principal Problem:   Term pregnancy, repeat Active Problems:   History of 2 cesarean sections   Postpartum care following cesarean delivery  Additional problems: none    Discharge diagnosis: Term Pregnancy Delivered                                              Post partum procedures: none Augmentation: N/A Complications: None  Hospital course: Sceduled C/S   37 y.o. yo [id number] at [redacted]w[redacted]d was admitted to the hospital 08/29/2022 for scheduled cesarean section with the following indication:Elective Repeat.Delivery details are as follows:  Membrane Rupture Time/Date: 8:31 AM ,08/29/2022   Delivery Method:C-Section, Low Transverse  Details of operation can be found in separate operative note.  Patient had a postpartum course complicated bynothing.  She is ambulating, tolerating a regular diet, passing flatus, and urinating well. Patient is discharged home in stable condition on  08/31/22        Newborn Data: Birth date:08/29/2022  Birth time:8:33 AM  Gender:Female  Living status:Living  Apgars:9 ,9  Weight:3240 g     Magnesium Sulfate received: No BMZ received: No Rhophylac:N/A MMR:No T-DaP:Given prenatally Flu: No Transfusion:No  Physical exam  Vitals:   08/30/22 2033 08/30/22 2247 08/31/22 0310 08/31/22 0821  BP: 101/66 114/76 103/66 113/61  Pulse: 74 80 77 85  Resp: 20 18 18 18   Temp: 98.4 F (36.9 C) 98.4 F (36.9 C) 98 F (36.7 C) 98.5 F (36.9 C)  TempSrc: Oral Oral Oral Oral  SpO2: 99% 98% 98% 99%   General: alert, cooperative, and no distress Lochia: appropriate Uterine Fundus: firm Incision:  Healing well with no significant drainage, Dressing is clean, dry, and intact DVT Evaluation: No evidence of DVT seen on physical exam. Negative Homan's sign. No cords or calf tenderness. Labs: Lab Results  Component Value Date   WBC 8.1 08/30/2022   HGB 8.4 (L) 08/30/2022   HCT 26.6 (L) 08/30/2022   MCV 82.4 08/30/2022   PLT 132 (L) 08/30/2022      Latest Ref Rng & Units 05/27/2022    9:13 AM  CMP  Glucose 70 - 99 mg/dL 89   BUN 6 - 20 mg/dL 6   Creatinine 5.62 - 1.30 mg/dL 8.65   Sodium 784 - 696 mmol/L 134   Potassium 3.5 - 5.1 mmol/L 3.7   Chloride 98 - 111 mmol/L 110   CO2 22 - 32 mmol/L 21   Calcium 8.9 - 10.3 mg/dL 8.0    Edinburgh Score:    08/29/2022    1:30 PM  Edinburgh Postnatal Depression Scale Screening Tool  I have been able to laugh and see the funny side of things. 0  I have looked forward with enjoyment to things. 0  I have blamed myself unnecessarily when things went wrong. 0  I have been anxious or worried for no good reason. 2  I have felt scared or panicky for no good reason. 0  Things have been getting on top of me. 0  I have been so unhappy that  I have had difficulty sleeping. 0  I have felt sad or miserable. 0  I have been so unhappy that I have been crying. 1  The thought of harming myself has occurred to me. 0  Edinburgh Postnatal Depression Scale Total 3      After visit meds:  Allergies as of 08/31/2022   No Known Allergies      Medication List     STOP taking these medications    aspirin EC 81 MG tablet       TAKE these medications    ibuprofen 600 MG tablet Commonly known as: ADVIL Take 1 tablet (600 mg total) by mouth every 6 (six) hours.   oxyCODONE-acetaminophen 5-325 MG tablet Commonly known as: PERCOCET/ROXICET Take 1-2 tablets by mouth every 4 (four) hours as needed for moderate pain.   Prenatal Vitamin 27-0.8 MG Tabs Take 1 tablet by mouth daily.               Discharge Care Instructions  (From  admission, onward)           Start     Ordered   08/31/22 0000  Leave dressing on - Keep it clean, dry, and intact until clinic visit        08/31/22 1336             Discharge home in stable condition Infant Feeding: Bottle and Breast Infant Disposition:home with mother Discharge instruction: per After Visit Summary and Postpartum booklet. Activity: Advance as tolerated. Pelvic rest for 6 weeks.  Diet: routine diet Anticipated Birth Control:  natural family planning lactational amenorrhea Postpartum Appointment:1 week Additional Postpartum F/U: Incision check 1 week Future Appointments: Future Appointments  Date Time Provider Department Center  09/05/2022  9:35 AM Linzie Collin, MD [facility] None   Follow up Visit: instructed to make an appointment with Dr. Logan Bores in one week.      08/31/2022 Mirna Mires, CNM

## 2022-09-05 ENCOUNTER — Encounter: Payer: Medicaid Other | Admitting: Obstetrics and Gynecology

## 2022-09-05 DIAGNOSIS — Z9889 Other specified postprocedural states: Secondary | ICD-10-CM

## 2022-09-09 ENCOUNTER — Telehealth: Payer: Self-pay

## 2022-09-09 NOTE — Telephone Encounter (Signed)
Chart reviewed. Patient had C-Section 08/29/22. She missed her incision check appointment on 5/10. Patient states she is still taking Percocet and Ibuprofen. Her pain is fine on meds. Advised Dr. Logan Bores schedule is full. We will contact her back with an appointment date/time.

## 2022-09-09 NOTE — Telephone Encounter (Signed)
Telephone Advice Record received via fax from [facility]. Caller Chief Complaint:  Caller states she had surgery last week and she is now having a lot of pain. The bandage did not come off. Caller states she will call back in the morning for appt.  Care Advice Given by Access Nurse per Guidline:  Declined Triage

## 2022-09-10 ENCOUNTER — Ambulatory Visit: Payer: Medicaid Other | Admitting: Advanced Practice Midwife

## 2022-09-10 NOTE — Telephone Encounter (Signed)
Spoke with patient. Advised Dr. Logan Bores only has availability in the afternoon. Appointment scheduled for tomorrow 5/16 @10 :35am with Erskine Squibb (approved by Erskine Squibb).

## 2022-09-11 ENCOUNTER — Ambulatory Visit (INDEPENDENT_AMBULATORY_CARE_PROVIDER_SITE_OTHER): Payer: Medicaid Other | Admitting: Advanced Practice Midwife

## 2022-09-11 ENCOUNTER — Encounter: Payer: Self-pay | Admitting: Advanced Practice Midwife

## 2022-09-11 VITALS — BP 131/84 | HR 74 | Wt 165.0 lb

## 2022-09-11 DIAGNOSIS — Z4889 Encounter for other specified surgical aftercare: Secondary | ICD-10-CM

## 2022-09-11 NOTE — Progress Notes (Signed)
Patient ID: Lori Richards, female   DOB: 09/01/1985, 37 y.o.   MRN: [id number]  Reason for Visit: Wound Check  Electronic Swahili interpreter is present for visit Subjective:  HPI:  Lori Richards is a 37 y.o. female being seen at 2 weeks postpartum for incision check. She has some pain that is mainly located at her umbilicus. The pain is controlled with PO medication. Her bleeding has slowed- now a small amount off and on. She is breastfeeding her baby which is going well. She had a mild headache yesterday and admitted she may not be drinking enough water.  She is accompanied by her husband.  Past Medical History:  Diagnosis Date   Mooren's corneal ulcer, right eye    Per IOM Documents   Uterine fibroid 3.4x3.3x3.2 cm on 03/18/22 u/s 03/24/2022   Family History  Problem Relation Age of Onset   Diabetes Father    Past Surgical History:  Procedure Laterality Date   CESAREAN SECTION     C Section 2019 and 2021   CESAREAN SECTION N/A 08/29/2022   Procedure: REPEAT CESAREAN DELIVERY;  Surgeon: Linzie Collin, MD;  Location: [facility] ORS;  Service: Obstetrics;  Laterality: N/A;   EYE SURGERY      Short Social History:  Social History   Tobacco Use   Smoking status: Never    Passive exposure: Never   Smokeless tobacco: Never  Substance Use Topics   Alcohol use: Never    No Known Allergies  Current Outpatient Medications  Medication Sig Dispense Refill   ibuprofen (ADVIL) 600 MG tablet Take 1 tablet (600 mg total) by mouth every 6 (six) hours. 30 tablet 0   oxyCODONE-acetaminophen (PERCOCET/ROXICET) 5-325 MG tablet Take 1-2 tablets by mouth every 4 (four) hours as needed for moderate pain. 30 tablet 0   Prenatal Vit-Fe Fumarate-FA (PRENATAL VITAMIN) 27-0.8 MG TABS Take 1 tablet by mouth daily. 100 tablet 0   No current facility-administered medications for this visit.    Review of Systems  Constitutional:  Negative for chills and fever.  HENT:  Negative  for congestion, ear discharge, ear pain, hearing loss, sinus pain and sore throat.   Eyes:  Negative for blurred vision and double vision.  Respiratory:  Negative for cough, shortness of breath and wheezing.   Cardiovascular:  Negative for chest pain, palpitations and leg swelling.  Gastrointestinal:  Positive for abdominal pain. Negative for blood in stool, constipation, diarrhea, heartburn, melena, nausea and vomiting.  Genitourinary:  Negative for dysuria, flank pain, frequency, hematuria and urgency.  Musculoskeletal:  Negative for back pain, joint pain and myalgias.  Skin:  Negative for itching and rash.  Neurological:  Negative for dizziness, tingling, tremors, sensory change, speech change, focal weakness, seizures, loss of consciousness, weakness and headaches.  Endo/Heme/Allergies:  Negative for environmental allergies. Does not bruise/bleed easily.  Psychiatric/Behavioral:  Negative for depression, hallucinations, memory loss, substance abuse and suicidal ideas. The patient is not nervous/anxious and does not have insomnia.        Objective:  Objective   Vitals:   09/11/22 1055  BP: 131/84  Pulse: 74  Weight: 165 lb (74.8 kg)   Body mass index is 32.22 kg/m. Constitutional: Well nourished, well developed female in no acute distress.  HEENT: normal Skin: Warm and dry.  Cardiovascular: Regular rate and rhythm.   Extremity:  no edema   Respiratory: Clear to auscultation bilateral. Normal respiratory effort Abdomen: soft, mildly tender at umbilicus, nondistended, no abnormal masses, honeycomb dressing removed, incision  is healed, 1 steri strip persists with old drainage  Psych: Alert and Oriented x3. No memory deficits. Normal mood and affect.    Assessment/Plan:     37 y.o. G46 P3013 female at 2 weeks postpartum, healed incision  Return in 4 weeks for 6 week postpartum visit   Tresea Mall, CNM [facility] 09/11/2022 1:15 PM

## 2022-09-17 ENCOUNTER — Ambulatory Visit (LOCAL_COMMUNITY_HEALTH_CENTER): Payer: Medicaid Other

## 2022-09-17 DIAGNOSIS — Z23 Encounter for immunization: Secondary | ICD-10-CM

## 2022-09-17 DIAGNOSIS — Z0289 Encounter for other administrative examinations: Secondary | ICD-10-CM

## 2022-09-17 DIAGNOSIS — Z719 Counseling, unspecified: Secondary | ICD-10-CM

## 2022-09-17 NOTE — Progress Notes (Signed)
Are you feeling sick today? No   Have you ever received a dose of COVID-19 Vaccine? [employer], [place], [place], [place], Other) Yes  If yes, which vaccine and how many doses?   07/19/2021   Did you bring the vaccination record card or other documentation?  No   Do you have a health condition or are undergoing treatment that makes you moderately or severely immunocompromised? This would include, but not be limited to: cancer, HIV, organ transplant, immunosuppressive therapy/high-dose corticosteroids, or moderate/severe primary immunodeficiency.  No  Have you received COVID-19 vaccine before or during hematopoietic cell transplant (HCT) or CAR-T-cell therapies? No  Have you ever had an allergic reaction to: (This would include a severe allergic reaction or a reaction that caused hives, swelling, or respiratory distress, including wheezing.) A component of a COVID-19 vaccine or a previous dose of COVID-19 vaccine? No   Have you ever had an allergic reaction to another vaccine (other thanCOVID-19 vaccine) or an injectable medication? (This would include a severe allergic reaction or a reaction that caused hives, swelling, or respiratory distress, including wheezing.)   No    Do you have a history of any of the following:  Myocarditis or Pericarditis No  Dermal fillers:  No  Multisystem Inflammatory Syndrome (MIS-C or MIS-A)? No  COVID-19 disease within the past 3 months? No  Vaccinated with monkeypox vaccine in the last 4 weeks? No   Patient seen for the following immunizations: Polio, Hep A, Covid-19 Moderna Spikevax 2023-2024  VIS forms given. NCIR immunization copy given. Tolerated well. Here for completion of [phone] form. Copy of document given to client.

## 2022-10-10 ENCOUNTER — Ambulatory Visit (INDEPENDENT_AMBULATORY_CARE_PROVIDER_SITE_OTHER): Payer: Medicaid Other | Admitting: Obstetrics and Gynecology

## 2022-10-10 ENCOUNTER — Encounter: Payer: Self-pay | Admitting: Obstetrics and Gynecology

## 2022-10-10 VITALS — BP 124/85 | HR 80 | Wt 171.0 lb

## 2022-10-10 DIAGNOSIS — Z9889 Other specified postprocedural states: Secondary | ICD-10-CM

## 2022-10-10 NOTE — Progress Notes (Signed)
HPI:      Lori Richards is a 37 y.o. [phone] who LMP was Patient's last menstrual period was 10/06/2022.  Subjective:   She presents today 6 weeks postop from cesarean delivery.  She is breast-feeding full-time.  She does not desire anything for birth control.  She plans on using cycle tracking.  She does have some pain in the midline from her previous incision and a little bit of pain along her left lower quadrant of her new incision.  This is not disabling.    Hx: The following portions of the patient's history were reviewed and updated as appropriate:             She  has a past medical history of Mooren's corneal ulcer, right eye and Uterine fibroid 3.4x3.3x3.2 cm on 03/18/22 u/s (03/24/2022). She does not have any pertinent problems on file. She  has a past surgical history that includes Cesarean section; Eye surgery; and Cesarean section (N/A, 08/29/2022). Her family history includes Diabetes in her father. She  reports that she has never smoked. She has never been exposed to tobacco smoke. She has never used smokeless tobacco. She reports that she does not drink alcohol and does not use drugs. She has a current medication list which includes the following prescription(s): ibuprofen, oxycodone-acetaminophen, and prenatal vitamin. She has No Known Allergies.       Review of Systems:  Review of Systems  Constitutional: Denied constitutional symptoms, night sweats, recent illness, fatigue, fever, insomnia and weight loss.  Eyes: Denied eye symptoms, eye pain, photophobia, vision change and visual disturbance.  Ears/Nose/Throat/Neck: Denied ear, nose, throat or neck symptoms, hearing loss, nasal discharge, sinus congestion and sore throat.  Cardiovascular: Denied cardiovascular symptoms, arrhythmia, chest pain/pressure, edema, exercise intolerance, orthopnea and palpitations.  Respiratory: Denied pulmonary symptoms, asthma, pleuritic pain, productive sputum, cough, dyspnea and  wheezing.  Gastrointestinal: Denied, gastro-esophageal reflux, melena, nausea and vomiting.  Genitourinary: Denied genitourinary symptoms including symptomatic vaginal discharge, pelvic relaxation issues, and urinary complaints.  Musculoskeletal: Denied musculoskeletal symptoms, stiffness, swelling, muscle weakness and myalgia.  Dermatologic: Denied dermatology symptoms, rash and scar.  Neurologic: Denied neurology symptoms, dizziness, headache, neck pain and syncope.  Psychiatric: Denied psychiatric symptoms, anxiety and depression.  Endocrine: Denied endocrine symptoms including hot flashes and night sweats.   Meds:   Current Outpatient Medications on File Prior to Visit  Medication Sig Dispense Refill   ibuprofen (ADVIL) 600 MG tablet Take 1 tablet (600 mg total) by mouth every 6 (six) hours. 30 tablet 0   oxyCODONE-acetaminophen (PERCOCET/ROXICET) 5-325 MG tablet Take 1-2 tablets by mouth every 4 (four) hours as needed for moderate pain. 30 tablet 0   Prenatal Vit-Fe Fumarate-FA (PRENATAL VITAMIN) 27-0.8 MG TABS Take 1 tablet by mouth daily. 100 tablet 0   No current facility-administered medications on file prior to visit.      Objective:     Vitals:   10/10/22 0939  BP: 124/85  Pulse: 80   Filed Weights   10/10/22 0939  Weight: 171 lb (77.6 kg)               Abdomen: Soft.  Non-tender.  No masses.  No HSM.  Incision/s: Intact.  Healing well.  No erythema.  No drainage.    Minimal pain with palpation.           Assessment:    [id number] Patient Active Problem List   Diagnosis Date Noted   Postpartum care following cesarean delivery 08/31/2022   History  of 2 cesarean sections 08/29/2022   Term pregnancy, repeat 08/29/2022   Labor and delivery indication for care or intervention 08/27/2022   [redacted] weeks gestation of pregnancy 08/27/2022   Polyhydramnios affecting pregnancy- resolved 4/8 06/05/2022   Uterine fibroid 3.4x3.3x3.2 cm on 03/18/22 u/s 03/24/2022   left  benign ovarian cyst 2.7x2.6x2.6 cm on 02/06/22 u/s 03/24/2022   Supervision of other high risk pregnancy, antepartum 02/27/2022   Obesity affecting pregnancy, antepartum 02/27/2022   H/O cesarean section x2 classical incisions 02/27/2022   Mooren's corneal ulcer, 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     1. Postoperative state   2. Postpartum care following cesarean delivery     Patient doing very well postpartum and postop.  Breast-feeding full-time.   Plan:            1.  Patient may resume normal activities with exception of heavy lifting.  Plan follow-up in 3 months. Orders No orders of the defined types were placed in this encounter.   No orders of the defined types were placed in this encounter.     F/U  Return in about 3 months (around 01/10/2023).  Elonda Husky, M.D. 10/10/2022 12:28 PM

## 2022-11-13 ENCOUNTER — Telehealth: Payer: Self-pay

## 2022-11-13 NOTE — Telephone Encounter (Signed)
Fax received today from Tasha Herbin - HR Generalist II at [employer] indicating client's 12 week approved maternity leave will end on 11/20/22 and requesting a return to work note.  [facility] last saw client at [facility] = 36 6/7 prior to her C-section by [facility] with subsequent post surgical incision check and then 6 week post-partum appt on 10/10/2022 with Dr. Logan Bores.  Per consult with E. Sciora CNM, [facility] is unable to provide her a return to work note based on above. Call to Ms. Herbin and counseled [facility] did not do any post-partum evaluation and return to work note needed to be done by [employer] (phone and fax numbers for agency provided to Ms Herbin). Fax labeled and sent for scanning. Jossie Ng, RN

## 2022-11-19 ENCOUNTER — Ambulatory Visit (LOCAL_COMMUNITY_HEALTH_CENTER): Payer: Medicaid Other

## 2022-11-19 VITALS — Wt 181.2 lb

## 2022-11-19 DIAGNOSIS — Z719 Counseling, unspecified: Secondary | ICD-10-CM

## 2022-11-19 DIAGNOSIS — Z23 Encounter for immunization: Secondary | ICD-10-CM

## 2022-11-19 NOTE — Progress Notes (Signed)
Patient seen for the following immunizations: Polio VIS forms given. NC[facility] immunization copy given.

## 2022-11-21 ENCOUNTER — Telehealth: Payer: Self-pay

## 2022-11-21 NOTE — Telephone Encounter (Signed)
Transportation set up through [employer] for eye clinic  [facility] [address] [place], [place] [address]XB July 31st 1045AM Trip  ref # [phone]

## 2022-11-26 DIAGNOSIS — H18713 Corneal ectasia, bilateral: Secondary | ICD-10-CM | POA: Insufficient documentation

## 2022-12-09 ENCOUNTER — Encounter: Payer: Self-pay | Admitting: Advanced Practice Midwife

## 2022-12-09 ENCOUNTER — Ambulatory Visit: Payer: Medicaid Other | Admitting: Advanced Practice Midwife

## 2022-12-09 VITALS — BP 126/71 | HR 85 | Wt 183.2 lb

## 2022-12-09 DIAGNOSIS — Z789 Other specified health status: Secondary | ICD-10-CM

## 2022-12-09 NOTE — Progress Notes (Signed)
The patient is here today along with interpreter. She says she was called by [place] to come in and be seen- she thinks for a "test" at 3 months postpartum. The visit was scheduled as a PAP smear. She has had a normal PAP smear less than 1 year ago. We determined that it may have been the health department who called her regarding some "test" that should be done 3 months after delivery. Most recently she was seen for Polio vaccine. It may be a follow up for that.   Tresea Mall, CNM [facility] 12/09/2022 5:10 PM

## 2023-01-26 ENCOUNTER — Telehealth: Payer: Self-pay

## 2023-01-26 NOTE — Telephone Encounter (Signed)
Client called to get transportation for her appt on 01/28/2023 at 930am for surgery f/u.  [facility] [phone] called and transport booked. Trip confirmation #: [id number].   Appt location:  Garner Gavel, MD Ophthalmology NPI: [id number] [address] [place] [address]   Phone: [phone] Fax: [phone]

## 2023-02-04 ENCOUNTER — Telehealth: Payer: Self-pay

## 2023-02-04 NOTE — Telephone Encounter (Signed)
Client called to get transportation for her rescheduled appt on 02/06/2023 at 1120am for surgery f/u.  [facility] [phone] called and transport booked. Trip confirmation #: [id number]   Appt location:   Garner Gavel, MD Ophthalmology NPI: [id number] [address] [place] [address]   Phone: [phone] Fax: [phone]

## 2023-02-04 NOTE — Telephone Encounter (Signed)
Client called to get transportation for her appt on 02/05/2023 at 1045am for surgery f/u.  [facility] [phone] called and transport booked. Trip confirmation #: [id number].    Appt location:   Garner Gavel, MD Ophthalmology NPI: [id number] [address] [place] [address]   Phone: [phone] Fax: [phone]

## 2023-02-25 ENCOUNTER — Telehealth: Payer: Self-pay

## 2023-02-25 NOTE — Telephone Encounter (Signed)
Client called to get transportation for her scheduled appt on 02/27/2023 at 845am for surgery f/u.  [facility] [phone] called and transport booked. Trip confirmation #: [id number]   Appt location:   Garner Gavel, MD Ophthalmology NPI: [id number] o [address] [place] [address]   Phone: [phone] Fax: [phone]

## 2023-03-06 ENCOUNTER — Telehealth: Payer: Self-pay

## 2023-03-06 NOTE — Telephone Encounter (Signed)
Client called to get transportation for her scheduled appt on 03/10/2023 at 4pm for surgery f/u.  [facility] [phone] called and transport booked. Trip confirmation #: [id number]   Appt location:   Garner Gavel, MD Ophthalmology NPI: [id number] o [address] [place] [address]   Phone: [phone] Fax: [phone]

## 2023-04-15 ENCOUNTER — Telehealth: Payer: Self-pay

## 2023-04-15 NOTE — Telephone Encounter (Signed)
Client called to get transportation for her scheduled appt on 04/17/2023 at 11:15am for surgery f/u.  [facility] [phone] called and transport booked. Trip confirmation #: [id number]   Appt location:   Garner Gavel, MD Ophthalmology NPI: [id number] o [address] [place] [address]   Phone: [phone] Fax: [phone]

## 2023-04-22 IMAGING — CT CT RENAL STONE PROTOCOL
2 of 4 series · 15 of 46 positions shown, 17 images · non-contrast
Comparison: None Available.

CLINICAL DATA: Flank pain.  Clinical suspicion for kidney stone.



[Series 2: ap without · axial · non-contrast · 0.66mm/px · z∈[-988,-573]mm · 12 of 93 slices shown, 14 images]
[im 5/93  soft-tissue]
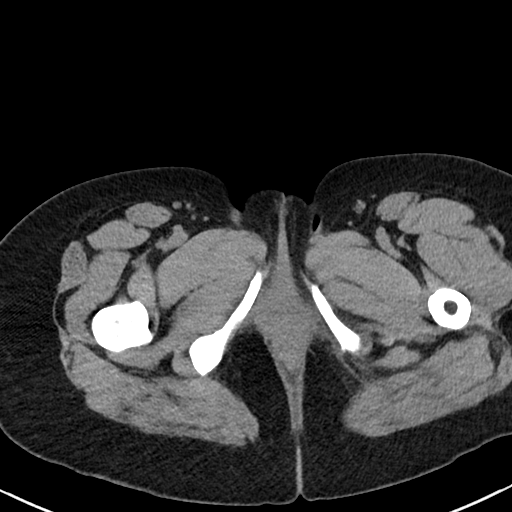
[im 5/93  bone]
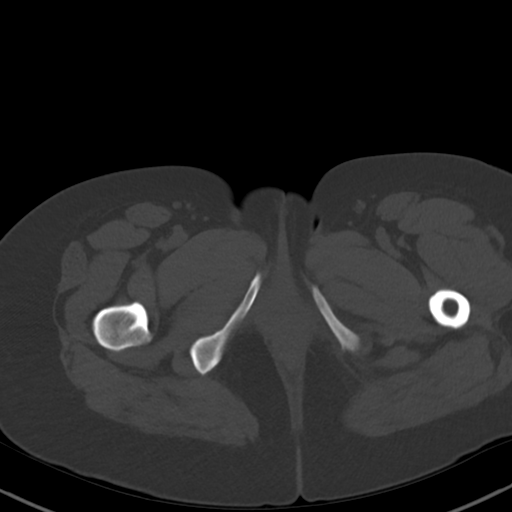
[im 14/93  soft-tissue]
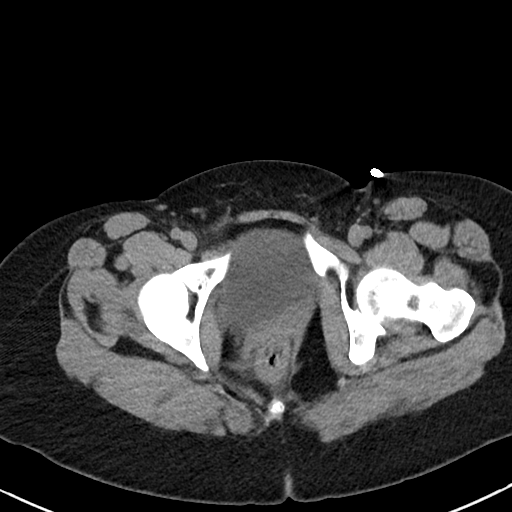
[im 19/93  soft-tissue]
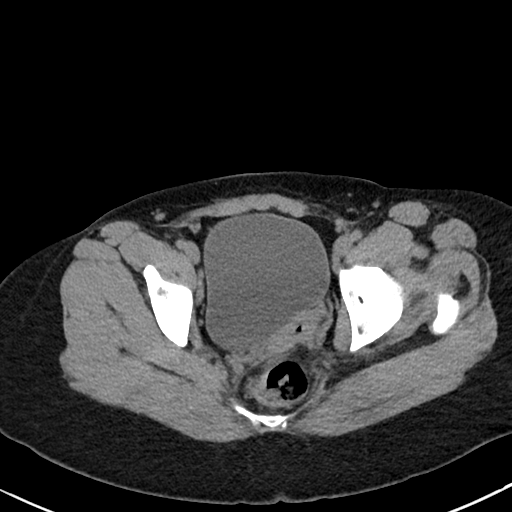
[im 28/93  soft-tissue]
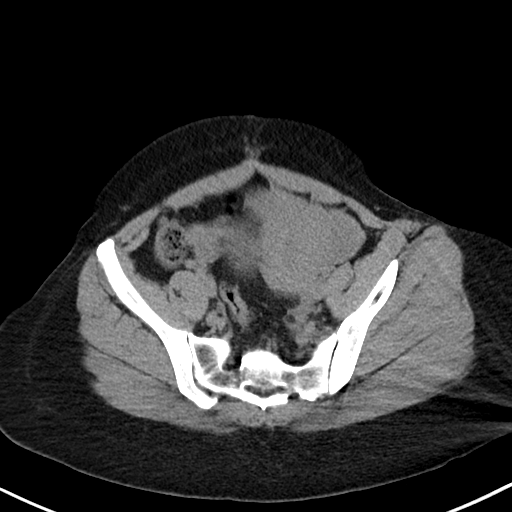
[im 37/93  soft-tissue]
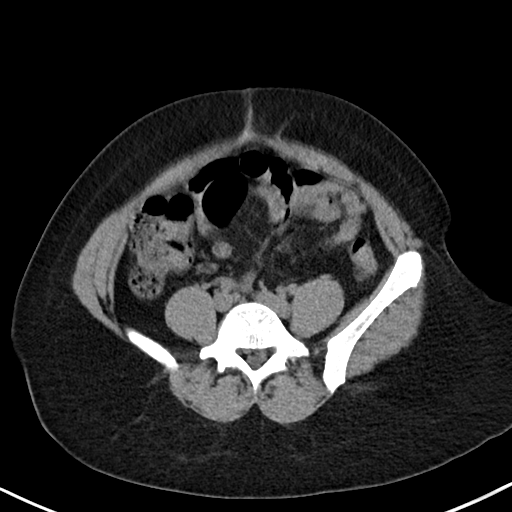
[im 42/93  soft-tissue]
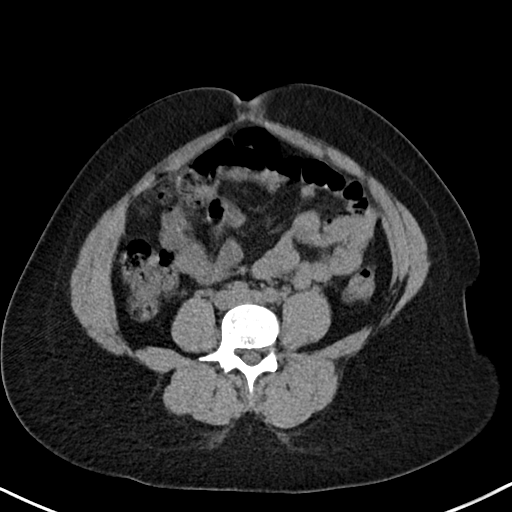
[im 51/93  soft-tissue]
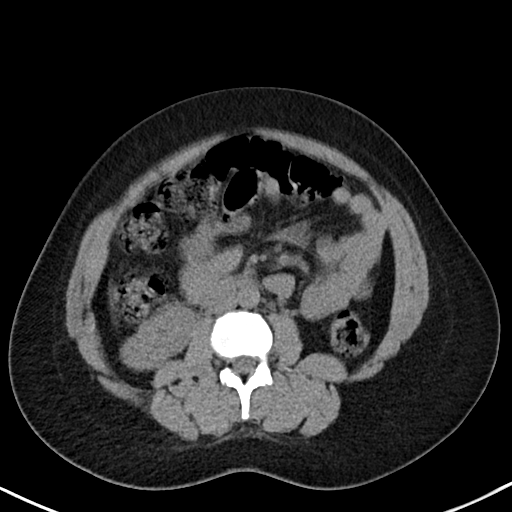
[im 56/93  soft-tissue]
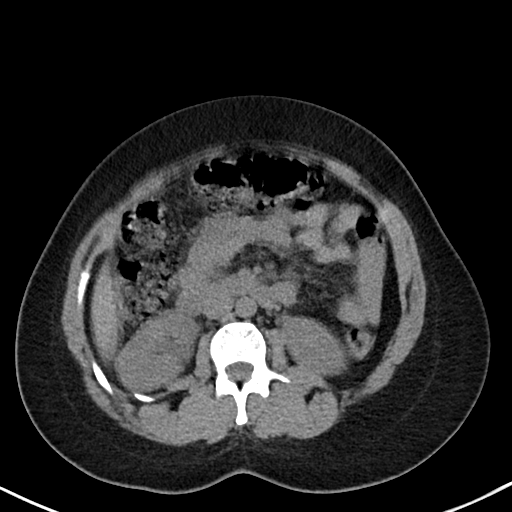
[im 65/93  soft-tissue]
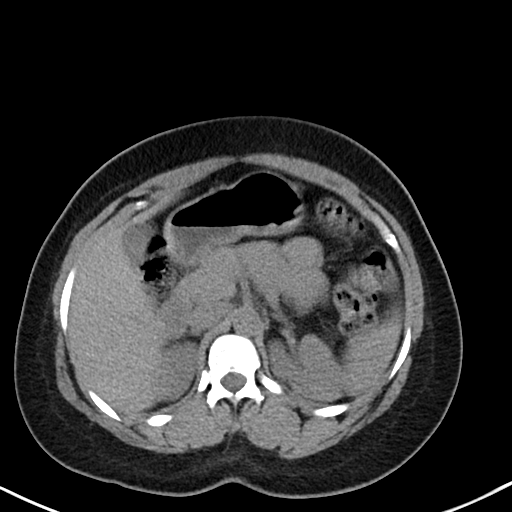
[im 65/93  bone]
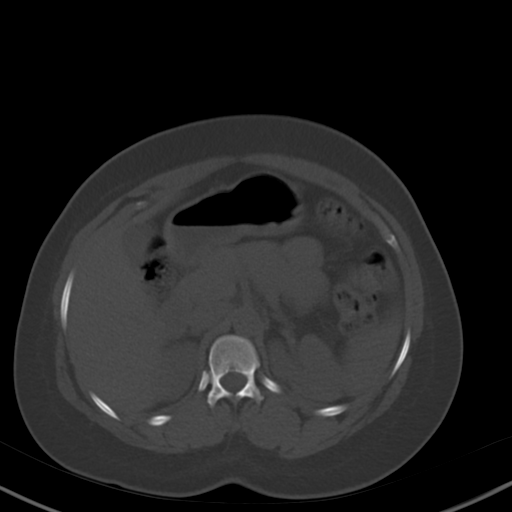
[im 74/93  soft-tissue]
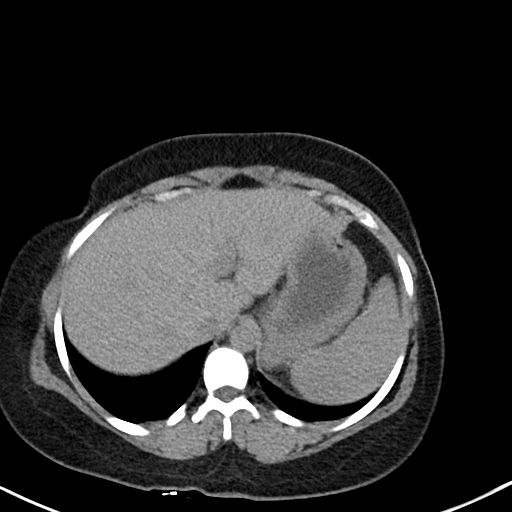
[im 79/93  soft-tissue]
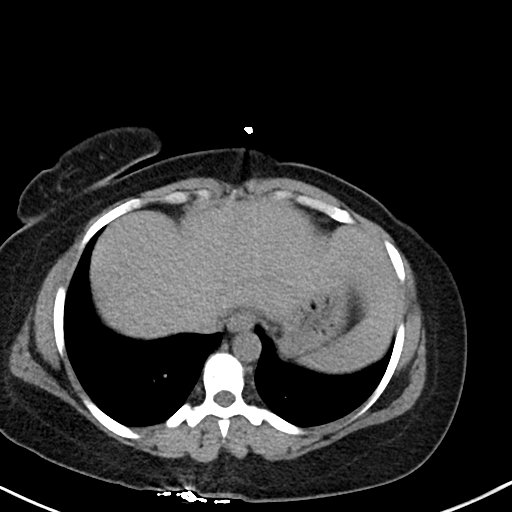
[im 88/93  soft-tissue]
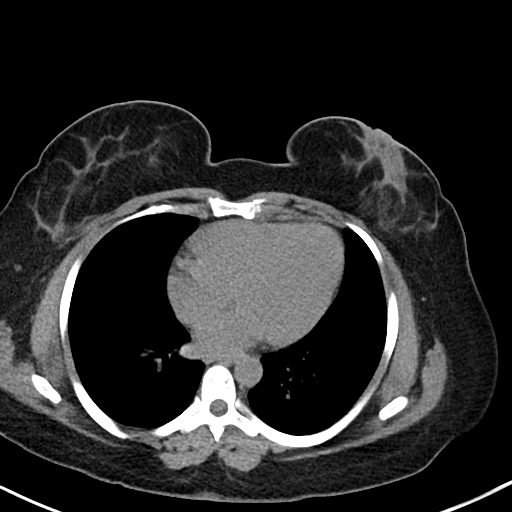

[Series 5: cor · coronal · 0.60mm/px · 3 of 94 slices shown]
[im 32/94  soft-tissue]
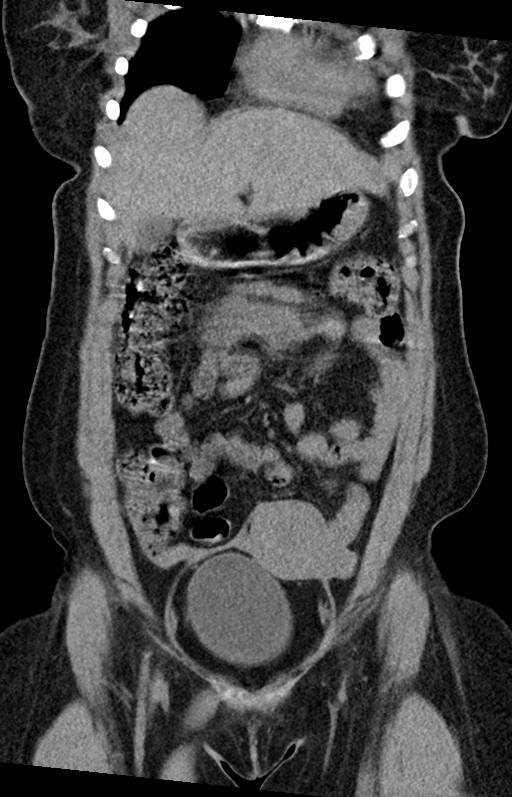
[im 42/94  soft-tissue]
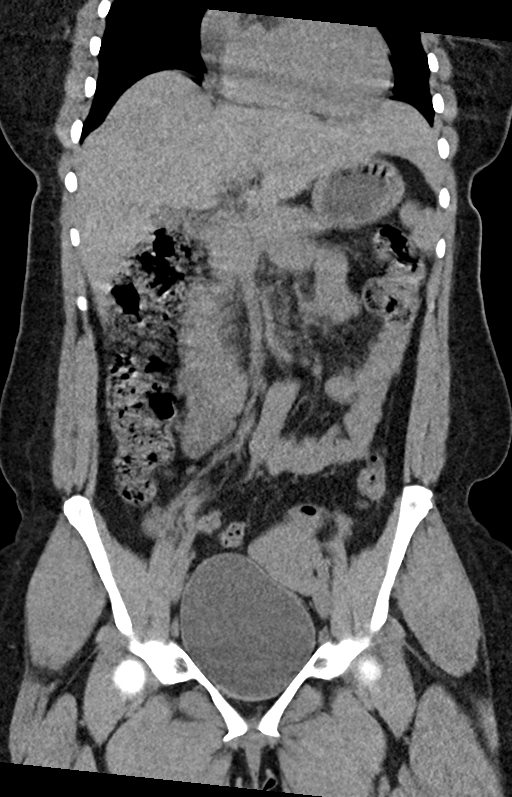
[im 52/94  soft-tissue]
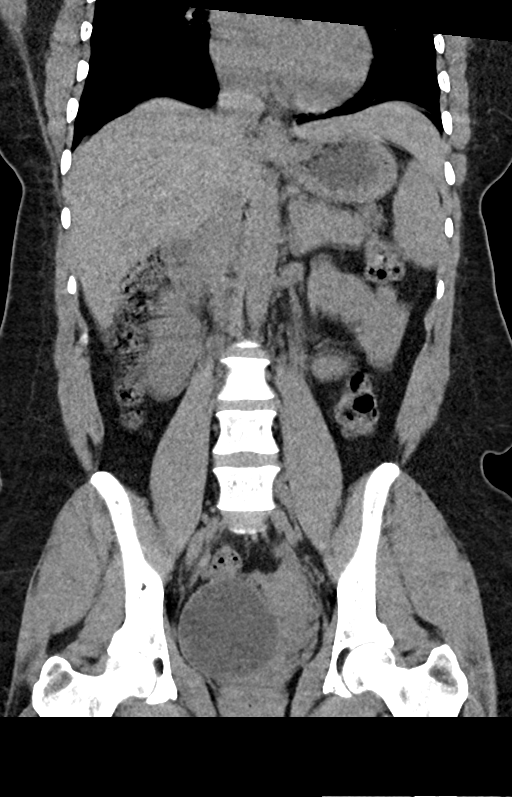

[15 of 46 positions shown; findings below may reference images not displayed]

FINDINGS: Study degraded by patient motion.

Lower chest: Clear lung bases.

Hepatobiliary: No focal liver abnormality is seen. No gallstones,
gallbladder wall thickening, or biliary dilatation.

Pancreas: Unremarkable. No pancreatic ductal dilatation or
surrounding inflammatory changes.

Spleen: Normal in size without focal abnormality.

Adrenals/Urinary Tract: Adrenal glands are unremarkable. Kidneys are
normal, without renal calculi, focal lesion, or hydronephrosis.
Bladder is unremarkable.

Stomach/Bowel: Stomach is within normal limits. Appendix appears
normal. No evidence of bowel wall thickening, distention, or
inflammatory changes.

Vascular/Lymphatic: No significant vascular findings are present. No
enlarged abdominal or pelvic lymph nodes.

Reproductive: Uterus and bilateral adnexa are unremarkable.

Other: No abdominal wall hernia or abnormality. No abdominopelvic
ascites.

Musculoskeletal: No acute or significant osseous findings.
IMPRESSION: 1. Normal exam. No renal or ureteral stones. No findings to account
for flank pain.

## 2023-05-08 NOTE — Progress Notes (Unsigned)
 Transportation arranged via [employer] for eye appointment at [facility] on 05/12/2023.   [facility] [employer] will pick her up at 2:20.   She is to call for pick up after the appointment.  Call number is [phone].   REFERENCE Number for the trip:  [phone]

## 2023-05-26 ENCOUNTER — Other Ambulatory Visit: Payer: Self-pay | Admitting: Physician Assistant

## 2023-05-26 ENCOUNTER — Ambulatory Visit
Admission: RE | Admit: 2023-05-26 | Discharge: 2023-05-26 | Disposition: A | Discharge status: Home / Self Care | Payer: Worker's Compensation | Source: Ambulatory Visit | Attending: Physician Assistant | Admitting: Physician Assistant

## 2023-05-26 ENCOUNTER — Ambulatory Visit
Admission: RE | Admit: 2023-05-26 | Discharge: 2023-05-26 | Disposition: A | Discharge status: Home / Self Care | Payer: Worker's Compensation | Attending: Physician Assistant | Admitting: Physician Assistant

## 2023-05-26 DIAGNOSIS — S4991XA Unspecified injury of right shoulder and upper arm, initial encounter: Secondary | ICD-10-CM | POA: Diagnosis present

## 2023-05-26 DIAGNOSIS — S59901A Unspecified injury of right elbow, initial encounter: Secondary | ICD-10-CM | POA: Insufficient documentation

## 2023-05-26 DIAGNOSIS — Y990 Civilian activity done for income or pay: Secondary | ICD-10-CM

## 2023-05-26 DIAGNOSIS — X509XXA Other and unspecified overexertion or strenuous movements or postures, initial encounter: Secondary | ICD-10-CM | POA: Insufficient documentation

## 2023-06-11 ENCOUNTER — Telehealth: Payer: Self-pay

## 2023-06-11 NOTE — Telephone Encounter (Signed)
Client called to get transportation for her scheduled appt on 06/15/2023 at 345pm for  f/u.  [facility] [phone] called and transport booked. Trip confirmation #: [id number]   Appt location:   Garner Gavel, MD Ophthalmology NPI: [id number] o [address] [place] [address]   Phone: [phone] Fax: [phone]

## 2023-07-06 ENCOUNTER — Telehealth: Payer: Self-pay

## 2023-07-06 NOTE — Telephone Encounter (Signed)
 Client called to get transportation for her scheduled appt on 07/13/2023 at 120pm for  f/u.  [facility] [phone] called and transport booked. Trip confirmation #: [id number]   Appt location:   Garner Gavel, MD Ophthalmology NPI: [id number] o [address] [place] [address] [address]   Phone: [phone] Fax: [phone]

## 2023-08-24 ENCOUNTER — Telehealth: Payer: Self-pay

## 2023-08-24 NOTE — Telephone Encounter (Signed)
 Client called to get transportation for her scheduled appt on 08/28/2023 at 250pm for  f/u.  [facility] [phone] called and transport booked. Trip confirmation #: [id number]   Appt location:   Ranjan, Sinthu Sivarama, MD Ophthalmology NPI: [id number] o [address] [place] [address] [address]   Phone: [phone] Fax: [phone]

## 2024-02-11 ENCOUNTER — Ambulatory Visit

## 2024-02-14 ENCOUNTER — Other Ambulatory Visit: Payer: Self-pay

## 2024-02-14 ENCOUNTER — Emergency Department
Admission: EM | Admit: 2024-02-14 | Discharge: 2024-02-14 | Disposition: A | Discharge status: Home / Self Care | Attending: Emergency Medicine | Admitting: Emergency Medicine

## 2024-02-14 DIAGNOSIS — R531 Weakness: Secondary | ICD-10-CM | POA: Diagnosis not present

## 2024-02-14 DIAGNOSIS — R040 Epistaxis: Secondary | ICD-10-CM | POA: Diagnosis present

## 2024-02-14 LAB — CBC WITH DIFFERENTIAL/PLATELET
Abs Immature Granulocytes: 0.01 K/uL (ref 0.00–0.07)
Basophils Absolute: 0.1 K/uL (ref 0.0–0.1)
Basophils Relative: 1 %
Eosinophils Absolute: 0.1 K/uL (ref 0.0–0.5)
Eosinophils Relative: 2 %
HCT: 35 % — ABNORMAL LOW (ref 36.0–46.0)
Hemoglobin: 10.6 g/dL — ABNORMAL LOW (ref 12.0–15.0)
Immature Granulocytes: 0 %
Lymphocytes Relative: 49 %
Lymphs Abs: 2.8 K/uL (ref 0.7–4.0)
MCH: 24.3 pg — ABNORMAL LOW (ref 26.0–34.0)
MCHC: 30.3 g/dL (ref 30.0–36.0)
MCV: 80.3 fL (ref 80.0–100.0)
Monocytes Absolute: 0.5 K/uL (ref 0.1–1.0)
Monocytes Relative: 8 %
Neutro Abs: 2.3 K/uL (ref 1.7–7.7)
Neutrophils Relative %: 40 %
Platelets: 295 K/uL (ref 150–400)
RBC: 4.36 MIL/uL (ref 3.87–5.11)
RDW: 16.6 % — ABNORMAL HIGH (ref 11.5–15.5)
WBC: 5.7 K/uL (ref 4.0–10.5)
nRBC: 0 % (ref 0.0–0.2)

## 2024-02-14 LAB — COMPREHENSIVE METABOLIC PANEL WITH GFR
ALT: 13 U/L (ref 0–44)
AST: 17 U/L (ref 15–41)
Albumin: 3.5 g/dL (ref 3.5–5.0)
Alkaline Phosphatase: 87 U/L (ref 38–126)
Anion gap: 12 (ref 5–15)
BUN: 6 mg/dL (ref 6–20)
CO2: 21 mmol/L — ABNORMAL LOW (ref 22–32)
Calcium: 8.6 mg/dL — ABNORMAL LOW (ref 8.9–10.3)
Chloride: 106 mmol/L (ref 98–111)
Creatinine, Ser: 0.57 mg/dL (ref 0.44–1.00)
GFR, Estimated: >60 mL/min (ref >60)
Glucose, Bld: 119 mg/dL — ABNORMAL HIGH (ref 70–99)
Potassium: 3.3 mmol/L — ABNORMAL LOW (ref 3.5–5.1)
Sodium: 139 mmol/L (ref 135–145)
Total Bilirubin: 0.8 mg/dL (ref 0.0–1.2)
Total Protein: 7.1 g/dL (ref 6.5–8.1)

## 2024-02-14 LAB — HCG, QUANTITATIVE, PREGNANCY: hCG, Beta Chain, Quant, S: <1 m[IU]/mL (ref <5)

## 2024-02-14 LAB — TROPONIN I (HIGH SENSITIVITY): Troponin I (High Sensitivity): <2 ng/L (ref <18)

## 2024-02-14 MED ORDER — SODIUM CHLORIDE 0.9 % IV BOLUS
1000.0000 mL | Freq: Once | INTRAVENOUS | Status: AC
  Administered 2024-02-14: 1000 mL via INTRAVENOUS

## 2024-02-14 NOTE — ED Provider Notes (Signed)
 [facility] Provider Note    Event Date/Time   First MD Initiated Contact with Patient 02/14/24 2017     (approximate)   History   Epistaxis and Weakness   HPI  Lori Richards is a 38 y.o. female with history of corneal ulcer status post corneal graft in 2024, history of uterine fibroid presenting with left nasal epistaxis.  Patient states that she was fine until her nose started bleeding, was bleeding for 30 minutes, states that it bleeding was so much that it came out from her mouth.  States that she felt very weak, lightheaded, dizzy.  She denies any falls or trauma.  Per independent history from EMS, they got a call that patient was bleeding of her nose, and that she was feeling very weak and dizzy, states that they found her in the parking lot since family brought her down there.  EMS states that she had prior eye surgery on the right and that the appearance is baseline for her.  Interpreter was used for encounter.     Physical Exam   Triage Vital Signs: ED Triage Vitals  Encounter Vitals Group     BP 02/14/24 2014 (!) 148/74     Girls Systolic BP Percentile --      Girls Diastolic BP Percentile --      Boys Systolic BP Percentile --      Boys Diastolic BP Percentile --      Pulse Rate 02/14/24 2014 62     Resp 02/14/24 2014 18     Temp 02/14/24 2014 98.1 F (36.7 C)     Temp Source 02/14/24 2014 Axillary     SpO2 --      Weight 02/14/24 2011 180 lb (81.6 kg)     Height 02/14/24 2011 5' (1.524 m)     Head Circumference --      Peak Flow --      Pain Score 02/14/24 2010 4     Pain Loc --      Pain Education --      Exclude from Growth Chart --     Most recent vital signs: Vitals:   02/14/24 2014  BP: (!) 148/74  Pulse: 62  Resp: 18  Temp: 98.1 F (36.7 C)  SpO2: 100%     General: Awake, no distress.  CV:  Good peripheral perfusion.  Resp:  Normal effort.  Abd:  No distention.  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  Dried blood around left nare without active bleeding, clear oropharynx.   ED Results / Procedures / Treatments   Labs (all labs ordered are listed, but only abnormal results are displayed) Labs Reviewed  COMPREHENSIVE METABOLIC PANEL WITH GFR - Abnormal; Notable for the following components:      Result Value   Potassium 3.3 (*)    CO2 21 (*)    Glucose, Bld 119 (*)    Calcium 8.6 (*)    All other components within normal limits  CBC WITH DIFFERENTIAL/PLATELET - Abnormal; Notable for the following components:   Hemoglobin 10.6 (*)    HCT 35.0 (*)    MCH 24.3 (*)    RDW 16.6 (*)    All other components within normal limits  HCG, QUANTITATIVE, PREGNANCY  TROPONIN I (HIGH SENSITIVITY)     EKG  EKG shows, sinus rhythm, rate 64, normal QS, normal QTc, no obvious ischemic ST elevation, T  wave flattening in 3, not significant change compared to prior    PROCEDURES:  Critical Care performed: No  Procedures   MEDICATIONS ORDERED IN ED: Medications  sodium chloride  0.9 % bolus 1,000 mL (1,000 mLs Intravenous New Bag/Given 02/14/24 2105)     IMPRESSION / MDM / ASSESSMENT AND PLAN / ED COURSE  I reviewed the triage vital signs and the nursing notes.                              Differential diagnosis includes, but is not limited to, epistaxis that has resolved, anemia, vasovagal, dehydration, electrolyte derangements.  Did consider arrhythmia or atypical ACS.  Will get labs, EKG, troponin, IV fluids.  Patient's presentation is most consistent with acute presentation with potential threat to life or bodily function.  Independent interpretation of labs and imaging below.  Labs are below.  Discussed with patient and family about lab results.  She is feeling better.  No additional epistaxis while in the emergency department.  States no primary care doctor, will put in a referral for her so that she can follow-up.   Otherwise considered but no indication for inpatient admission at this time, she is safe for outpatient management.  Discharged with strict return precautions.  The patient is on the cardiac monitor to evaluate for evidence of arrhythmia and/or significant heart rate changes.   Clinical Course as of 02/14/24 2139  [place] Feb 14, 2024  2136 Independent review of labs, H&H is improving compared to prior, hCG is not elevated, electrolytes not severely deranged, LFTs are normal, troponins not elevated. [TT]    Clinical Course User Index [TT] Waymond Lorelle Cummins, MD     FINAL CLINICAL IMPRESSION(S) / ED DIAGNOSES   Final diagnoses:  Epistaxis  Weakness     Rx / DC Orders   ED Discharge Orders          Ordered    Ambulatory Referral to Primary Care (Establish Care)        02/14/24 2138             Note:  This document was prepared using Dragon voice recognition software and may include unintentional dictation errors.    Waymond Lorelle Cummins, MD 02/14/24 2139

## 2024-02-14 NOTE — Discharge Instructions (Signed)
 Please make sure to keep self hydrated.  I put in a primary care doctor for referral for you so they can follow-up.  If you cough or sneeze, please make sure to hold your nose.  Please try not to blow your nose for the next 1 to 2 days.

## 2024-02-14 NOTE — ED Triage Notes (Signed)
 Pt to ED by [facility] from home, EMS was called for bloody nose and weakness. Pt's family reports pt is unable to walk or talk. Pt awake, lethargic in triage.

## 2024-03-16 ENCOUNTER — Ambulatory Visit: Payer: Self-pay | Admitting: Family Medicine

## 2024-03-23 ENCOUNTER — Emergency Department
Admission: EM | Admit: 2024-03-23 | Discharge: 2024-03-23 | Disposition: A | Discharge status: Home / Self Care | Attending: Emergency Medicine | Admitting: Emergency Medicine

## 2024-03-23 ENCOUNTER — Other Ambulatory Visit: Payer: Self-pay

## 2024-03-23 DIAGNOSIS — R509 Fever, unspecified: Secondary | ICD-10-CM | POA: Diagnosis present

## 2024-03-23 DIAGNOSIS — R531 Weakness: Secondary | ICD-10-CM

## 2024-03-23 DIAGNOSIS — B349 Viral infection, unspecified: Secondary | ICD-10-CM | POA: Diagnosis not present

## 2024-03-23 LAB — COMPREHENSIVE METABOLIC PANEL WITH GFR
ALT: 11 U/L (ref 0–44)
AST: 15 U/L (ref 15–41)
Albumin: 4.2 g/dL (ref 3.5–5.0)
Alkaline Phosphatase: 106 U/L (ref 38–126)
Anion gap: 10 (ref 5–15)
BUN: 8 mg/dL (ref 6–20)
CO2: 24 mmol/L (ref 22–32)
Calcium: 9 mg/dL (ref 8.9–10.3)
Chloride: 103 mmol/L (ref 98–111)
Creatinine, Ser: 0.66 mg/dL (ref 0.44–1.00)
GFR, Estimated: >60 mL/min (ref >60)
Glucose, Bld: 144 mg/dL — ABNORMAL HIGH (ref 70–99)
Potassium: 4.2 mmol/L (ref 3.5–5.1)
Sodium: 138 mmol/L (ref 135–145)
Total Bilirubin: 0.3 mg/dL (ref 0.0–1.2)
Total Protein: 7.8 g/dL (ref 6.5–8.1)

## 2024-03-23 LAB — URINALYSIS, ROUTINE W REFLEX MICROSCOPIC
Bacteria, UA: NONE SEEN
Bilirubin Urine: NEGATIVE
Glucose, UA: NEGATIVE mg/dL
Hgb urine dipstick: NEGATIVE
Ketones, ur: NEGATIVE mg/dL
Nitrite: NEGATIVE
Protein, ur: NEGATIVE mg/dL
Specific Gravity, Urine: 1.016 (ref 1.005–1.030)
pH: 5 (ref 5.0–8.0)

## 2024-03-23 LAB — RESP PANEL BY RT-PCR (RSV, FLU A&B, COVID)  RVPGX2
Influenza A by PCR: NEGATIVE
Influenza B by PCR: NEGATIVE
Resp Syncytial Virus by PCR: NEGATIVE
SARS Coronavirus 2 by RT PCR: NEGATIVE

## 2024-03-23 LAB — CBC
HCT: 35.4 % — ABNORMAL LOW (ref 36.0–46.0)
Hemoglobin: 11.1 g/dL — ABNORMAL LOW (ref 12.0–15.0)
MCH: 24.4 pg — ABNORMAL LOW (ref 26.0–34.0)
MCHC: 31.4 g/dL (ref 30.0–36.0)
MCV: 78 fL — ABNORMAL LOW (ref 80.0–100.0)
Platelets: 287 K/uL (ref 150–400)
RBC: 4.54 MIL/uL (ref 3.87–5.11)
RDW: 16.4 % — ABNORMAL HIGH (ref 11.5–15.5)
WBC: 6.1 K/uL (ref 4.0–10.5)
nRBC: 0 % (ref 0.0–0.2)

## 2024-03-23 LAB — POC URINE PREG, ED: Preg Test, Ur: NEGATIVE

## 2024-03-23 MED ORDER — IBUPROFEN 800 MG PO TABS: 800.0000 mg | ORAL_TABLET | Freq: Three times a day (TID) | ORAL | 0 refills | Status: DC | PRN

## 2024-03-23 MED ORDER — ACETAMINOPHEN 325 MG PO TABS
650.0000 mg | ORAL_TABLET | Freq: Once | ORAL | Status: AC
  Administered 2024-03-23: 650 mg via ORAL
  Filled 2024-03-23: qty 2

## 2024-03-23 NOTE — ED Triage Notes (Signed)
 Pt to ED for c/o feeling hot to the touch, but cold, feeling weak and having pain in the joints and hips for the past three to five days. Pt states every day it gets worse.

## 2024-03-23 NOTE — ED Provider Notes (Signed)
 [facility] Emergency Department Provider Note     Event Date/Time   First MD Initiated Contact with Patient 03/23/24 1115     (approximate)   History   Weakness   HPI  History limited by acute Swahili language.  Tele-interpreter [phone]) used for interview and exam.  Lori Richards is a 38 y.o. female c/o loss of appetite, joint pains, muscle pain,  and generalized weakness.  She would also endorse subjective fevers. No NVD, no bowel changes, no dysuria, no sick contact contacts, no recent travel is reported.  No reports of any bowel changes, cough, congestion, or shortness of breath.    Physical Exam   Triage Vital Signs: ED Triage Vitals  Encounter Vitals Group     BP 03/23/24 1046 (!) 146/107     Girls Systolic BP Percentile --      Girls Diastolic BP Percentile --      Boys Systolic BP Percentile --      Boys Diastolic BP Percentile --      Pulse Rate 03/23/24 1046 77     Resp 03/23/24 1046 16     Temp 03/23/24 1046 99.2 F (37.3 C)     Temp Source 03/23/24 1046 Oral     SpO2 03/23/24 1046 100 %     Weight 03/23/24 1050 165 lb 5.5 oz (75 kg)     Height 03/23/24 1050 5' 2 (1.575 m)     Head Circumference --      Peak Flow --      Pain Score 03/23/24 1047 7     Pain Loc --      Pain Education --      Exclude from Growth Chart --     Most recent vital signs: Vitals:   03/23/24 1110 03/23/24 1340  BP:  129/83  Pulse:  65  Resp:  18  Temp:    SpO2: 100% 100%    General Awake, no distress. NAD HEENT NCAT. PERRL. EOMI. No rhinorrhea. Mucous membranes are moist.  CV:  Good peripheral perfusion. RRR RESP:  Normal effort. CTA ABD:  No distention.  Soft and nontender.  No rebound, guarding, or rigidity noted. MSK:  AROM of all extremities   ED Results / Procedures / Treatments   Labs (all labs ordered are listed, but only abnormal results are displayed) Labs Reviewed  COMPREHENSIVE METABOLIC PANEL WITH GFR -  Abnormal; Notable for the following components:      Result Value   Glucose, Bld 144 (*)    All other components within normal limits  CBC - Abnormal; Notable for the following components:   Hemoglobin 11.1 (*)    HCT 35.4 (*)    MCV 78.0 (*)    MCH 24.4 (*)    RDW 16.4 (*)    All other components within normal limits  URINALYSIS, ROUTINE W REFLEX MICROSCOPIC - Abnormal; Notable for the following components:   Color, Urine YELLOW (*)    APPearance HAZY (*)    Leukocytes,Ua TRACE (*)    All other components within normal limits  RESP PANEL BY RT-PCR (RSV, FLU A&B, COVID)  [id number]  POC URINE PREG, ED    EKG   RADIOLOGY   No results found.   PROCEDURES:  Critical Care performed: No  Procedures   MEDICATIONS ORDERED IN ED: Medications  acetaminophen  (TYLENOL ) tablet 650 mg (650 mg Oral Given 03/23/24 1225)     IMPRESSION / MDM / ASSESSMENT AND PLAN /  ED COURSE  I reviewed the triage vital signs and the nursing notes.                              Differential diagnosis includes, but is not limited to, COVID, flu, RSV, viral URI, UTI, pregnancy, electrolyte abnormality, anemia  Patient's presentation is most consistent with acute complicated illness / injury requiring diagnostic workup.  Patient's diagnosis is consistent with generalized weakness and fatigue of unclear etiology.  Patient with no reports of any sick contacts or recent travel.  No frank fevers, nausea or vomiting, bowel changes are reported.  Patient is afebrile on presentation with reassuring vital signs.  No signs of acute distress on exam.  Labs interpreted by me, show no acute leukocytosis or critical anemia.  No electrolyte abnormalities noted.  No UA evidence of UTI, and viral panel test is negative at this time.  Patient is advised to continue to monitor symptoms and treat as appropriate.  Patient will be discharged home with prescriptions for ibuprofen . Patient is to follow up with a local urgent  care or community clinic as needed or otherwise directed. Patient is given ED precautions to return to the ED for any worsening or new symptoms.     FINAL CLINICAL IMPRESSION(S) / ED DIAGNOSES   Final diagnoses:  Generalized weakness  Viral illness     Rx / DC Orders   ED Discharge Orders          Ordered    ibuprofen  (ADVIL ) 800 MG tablet  Every 8 hours PRN        03/23/24 1343             Note:  This document was prepared using Dragon voice recognition software and may include unintentional dictation errors.    Loyd Candida LULLA Aldona, PA-C 03/23/24 1914    Jacolyn Pae, MD 03/23/24 [phone]

## 2024-03-23 NOTE — Discharge Instructions (Signed)
 Your exam and labs are normal and reassuring. There is no evidence of any acute illness, dehydration, or abnormal labs. Your symptoms may be due to a viral illness. Take OTC Tylenol  and Ibuprofen  as needed for pain. Rest and hydrate to prevent dehydration. Follow-up with a local community clinic or return if needed.   Mitihani na sherley allan ni ya alonso na ya marlene moyo. Hakuna ushahidi wa boeing wa papo hapo, upungufu wa maji mwilini, au maabara isiyo ya kawaida. Dalili zako zinaweza kuwa kutokana na ugonjwa wa virusi. [place] OTC Tylenol  na Ibuprofen  inavyohitajika kwa maumivu. Pumzika na maji ili kuzuia upungufu wa maji mwilini. Fuatilia kliniki ya jamii ya eneo lako au urudi ikiwa inahitajika.

## 2024-03-23 NOTE — ED Notes (Signed)
 Interpreter used

## 2024-05-02 ENCOUNTER — Ambulatory Visit: Admitting: Family Medicine

## 2024-05-02 DIAGNOSIS — Z7689 Persons encountering health services in other specified circumstances: Secondary | ICD-10-CM

## 2024-05-02 NOTE — Progress Notes (Deleted)
 "  New Patient Office Visit  Patient ID: Lori Richards, Female   DOB: 02-04-1986 39 y.o. MRN: [id number]  No chief complaint on file.  Subjective:     Lori Richards presents to establish care  HPI  Discussed the use of AI scribe software for clinical note transcription with the patient, who gave verbal consent to proceed.  History of Present Illness    Outpatient Encounter Medications as of 05/02/2024  Medication Sig   ibuprofen  (ADVIL ) 600 MG tablet Take 1 tablet (600 mg total) by mouth every 6 (six) hours.   ibuprofen  (ADVIL ) 800 MG tablet Take 1 tablet (800 mg total) by mouth every 8 (eight) hours as needed.   Prenatal Vit-Fe Fumarate-FA (PRENATAL VITAMIN) 27-0.8 MG TABS Take 1 tablet by mouth daily. (Patient not taking: Reported on 12/09/2022)   No facility-administered encounter medications on file as of 05/02/2024.    Past Medical History:  Diagnosis Date   Mooren's corneal ulcer, right eye    Per IOM Documents   Uterine fibroid 3.4x3.3x3.2 cm on 03/18/22 u/s 03/24/2022    Past Surgical History:  Procedure Laterality Date   CESAREAN SECTION     C Section 2019 and 2021   CESAREAN SECTION N/A 08/29/2022   Procedure: REPEAT CESAREAN DELIVERY;  Surgeon: Janit Alm Agent, MD;  Location: [facility] ORS;  Service: Obstetrics;  Laterality: N/A;   EYE SURGERY      Family History  Problem Relation Age of Onset   Diabetes Father     Social History   Socioeconomic History   Marital status: Single    Spouse name: Eva   Number of children: 2   Years of education: 12   Highest education level: High school graduate  Occupational History    Comment: [employer]  Tobacco Use   Smoking status: Never    Passive exposure: Never   Smokeless tobacco: Never  Vaping Use   Vaping status: Never Used  Substance and Sexual Activity   Alcohol use: Never   Drug use: Never   Sexual activity: Not Currently    Partners: Male    Birth control/protection: None   Other Topics Concern   Not on file  Social History Narrative   ** Merged History Encounter **       ** Merged History Encounter **       Lives with husband and their 2 children and his daughter from another relationship.   Social Drivers of Health   Tobacco Use: Low Risk (03/28/2024)   Patient History    Smoking Tobacco Use: Never    Smokeless Tobacco Use: Never    Passive Exposure: Never  Financial Resource Strain: Medium Risk (02/27/2022)   Overall Financial Resource Strain (CARDIA)    Difficulty of Paying Living Expenses: Somewhat hard  Food Insecurity: No Food Insecurity (08/29/2022)   Hunger Vital Sign    Worried About Running Out of Food in the Last Year: Never true    Ran Out of Food in the Last Year: Never true  Transportation Needs: No Transportation Needs (08/29/2022)   PRAPARE - Administrator, Civil Service (Medical): No    Lack of Transportation (Non-Medical): No  Physical Activity: Not on file  Stress: Not on file  Social Connections: Not on file  Intimate Partner Violence: Not At Risk (08/29/2022)   Humiliation, Afraid, Rape, and Kick questionnaire    Fear of Current or Ex-Partner: No    Emotionally Abused: No    Physically Abused: No  Sexually Abused: No  Depression (PHQ2-9): Low Risk (08/21/2022)   Depression (PHQ2-9)    PHQ-2 Score: 3  Alcohol Screen: Not on file  Housing: Low Risk (02/27/2022)   Housing    Last Housing Risk Score: 0  Utilities: Not At Risk (08/29/2022)   AHC Utilities    Threatened with loss of utilities: No  Health Literacy: Not on file    ROS    Objective:    There were no vitals taken for this visit.  Physical Exam {PhysExam Abridge (Optional):210964309}  {Labs (Optional):23779}    Assessment & Plan:   Problem List Items Addressed This Visit     Corneal ectasia of both eyes   Other Visit Diagnoses       Encounter to establish care with new doctor    -  Primary       Assessment and Plan Assessment &  Plan     No follow-ups on file.   Rockie Agent, MD [facility]     "

## 2024-05-10 ENCOUNTER — Emergency Department

## 2024-05-10 ENCOUNTER — Inpatient Hospital Stay
Admission: EM | Admit: 2024-05-10 | Discharge: 2024-05-12 | DRG: 156 | Disposition: A | Discharge status: Home / Self Care | Attending: Internal Medicine | Admitting: Internal Medicine

## 2024-05-10 DIAGNOSIS — R1310 Dysphagia, unspecified: Secondary | ICD-10-CM | POA: Diagnosis present

## 2024-05-10 DIAGNOSIS — Z98891 History of uterine scar from previous surgery: Secondary | ICD-10-CM

## 2024-05-10 DIAGNOSIS — Z833 Family history of diabetes mellitus: Secondary | ICD-10-CM

## 2024-05-10 DIAGNOSIS — Z6830 Body mass index (BMI) 30.0-30.9, adult: Secondary | ICD-10-CM

## 2024-05-10 DIAGNOSIS — K1121 Acute sialoadenitis: Principal | ICD-10-CM | POA: Diagnosis present

## 2024-05-10 DIAGNOSIS — E1165 Type 2 diabetes mellitus with hyperglycemia: Secondary | ICD-10-CM | POA: Diagnosis present

## 2024-05-10 DIAGNOSIS — E66811 Obesity, class 1: Secondary | ICD-10-CM | POA: Diagnosis present

## 2024-05-10 DIAGNOSIS — E669 Obesity, unspecified: Secondary | ICD-10-CM | POA: Diagnosis present

## 2024-05-10 DIAGNOSIS — K1120 Sialoadenitis, unspecified: Secondary | ICD-10-CM | POA: Diagnosis not present

## 2024-05-10 DIAGNOSIS — H16001 Unspecified corneal ulcer, right eye: Secondary | ICD-10-CM | POA: Diagnosis present

## 2024-05-10 DIAGNOSIS — Z1152 Encounter for screening for COVID-19: Secondary | ICD-10-CM

## 2024-05-10 DIAGNOSIS — H16051 Mooren's corneal ulcer, right eye: Secondary | ICD-10-CM | POA: Diagnosis present

## 2024-05-10 DIAGNOSIS — T380X5A Adverse effect of glucocorticoids and synthetic analogues, initial encounter: Secondary | ICD-10-CM | POA: Diagnosis present

## 2024-05-10 DIAGNOSIS — Z59868 Other specified financial insecurity: Secondary | ICD-10-CM

## 2024-05-10 HISTORY — DX: Obesity, unspecified: E66.9

## 2024-05-10 LAB — RESP PANEL BY RT-PCR (RSV, FLU A&B, COVID)  RVPGX2
Influenza A by PCR: NEGATIVE
Influenza B by PCR: NEGATIVE
Resp Syncytial Virus by PCR: NEGATIVE
SARS Coronavirus 2 by RT PCR: NEGATIVE

## 2024-05-10 LAB — COMPREHENSIVE METABOLIC PANEL WITH GFR
ALT: 12 U/L (ref 0–44)
AST: 16 U/L (ref 15–41)
Albumin: 4.1 g/dL (ref 3.5–5.0)
Alkaline Phosphatase: 100 U/L (ref 38–126)
Anion gap: 11 (ref 5–15)
BUN: 10 mg/dL (ref 6–20)
CO2: 23 mmol/L (ref 22–32)
Calcium: 8.8 mg/dL — ABNORMAL LOW (ref 8.9–10.3)
Chloride: 103 mmol/L (ref 98–111)
Creatinine, Ser: 0.61 mg/dL (ref 0.44–1.00)
GFR, Estimated: >60 mL/min
Glucose, Bld: 113 mg/dL — ABNORMAL HIGH (ref 70–99)
Potassium: 3.8 mmol/L (ref 3.5–5.1)
Sodium: 137 mmol/L (ref 135–145)
Total Bilirubin: 0.3 mg/dL (ref 0.0–1.2)
Total Protein: 7.2 g/dL (ref 6.5–8.1)

## 2024-05-10 LAB — CBC
HCT: 33.6 % — ABNORMAL LOW (ref 36.0–46.0)
Hemoglobin: 10.3 g/dL — ABNORMAL LOW (ref 12.0–15.0)
MCH: 24.2 pg — ABNORMAL LOW (ref 26.0–34.0)
MCHC: 30.7 g/dL (ref 30.0–36.0)
MCV: 79.1 fL — ABNORMAL LOW (ref 80.0–100.0)
Platelets: 267 K/uL (ref 150–400)
RBC: 4.25 MIL/uL (ref 3.87–5.11)
RDW: 16 % — ABNORMAL HIGH (ref 11.5–15.5)
WBC: 5 K/uL (ref 4.0–10.5)
nRBC: 0 % (ref 0.0–0.2)

## 2024-05-10 LAB — POC URINE PREG, ED: Preg Test, Ur: NEGATIVE

## 2024-05-10 LAB — GROUP A STREP BY PCR: Group A Strep by PCR: NOT DETECTED

## 2024-05-10 MED ORDER — IOHEXOL 300 MG/ML  SOLN
75.0000 mL | Freq: Once | INTRAMUSCULAR | Status: AC | PRN
  Administered 2024-05-10: 75 mL via INTRAVENOUS

## 2024-05-10 MED ORDER — LIDOCAINE VISCOUS HCL 2 % MT SOLN
15.0000 mL | Freq: Once | OROMUCOSAL | Status: AC
  Administered 2024-05-10: 15 mL via OROMUCOSAL
  Filled 2024-05-10: qty 15

## 2024-05-10 MED ORDER — DEXAMETHASONE SOD PHOSPHATE PF 10 MG/ML IJ SOLN
10.0000 mg | Freq: Three times a day (TID) | INTRAMUSCULAR | Status: DC
  Administered 2024-05-11 (×4): 10 mg via INTRAVENOUS
  Filled 2024-05-10 (×4): qty 1

## 2024-05-10 MED ORDER — ONDANSETRON HCL 4 MG/2ML IJ SOLN: 4.0000 mg | Freq: Three times a day (TID) | INTRAMUSCULAR | Status: DC | PRN

## 2024-05-10 MED ORDER — ACETAMINOPHEN 325 MG PO TABS: 650.0000 mg | ORAL_TABLET | Freq: Four times a day (QID) | ORAL | Status: DC | PRN

## 2024-05-10 MED ORDER — SODIUM CHLORIDE 0.9 % IV SOLN
3.0000 g | Freq: Once | INTRAVENOUS | Status: AC
  Administered 2024-05-10: 3 g via INTRAVENOUS
  Filled 2024-05-10: qty 8

## 2024-05-10 MED ORDER — OXYCODONE HCL 5 MG PO TABS
5.0000 mg | ORAL_TABLET | Freq: Once | ORAL | Status: AC
  Administered 2024-05-10: 5 mg via ORAL
  Filled 2024-05-10: qty 1

## 2024-05-10 MED ORDER — MORPHINE SULFATE (PF) 2 MG/ML IV SOLN
1.0000 mg | INTRAVENOUS | Status: DC | PRN
  Administered 2024-05-11: 1 mg via INTRAVENOUS
  Filled 2024-05-10: qty 1

## 2024-05-10 MED ORDER — ACETAMINOPHEN 500 MG PO TABS
1000.0000 mg | ORAL_TABLET | Freq: Once | ORAL | Status: AC
  Administered 2024-05-10: 1000 mg via ORAL
  Filled 2024-05-10: qty 2

## 2024-05-10 MED ORDER — ONDANSETRON HCL 4 MG/2ML IJ SOLN
4.0000 mg | Freq: Once | INTRAMUSCULAR | Status: AC
  Administered 2024-05-10: 4 mg via INTRAVENOUS
  Filled 2024-05-10: qty 2

## 2024-05-10 MED ORDER — OXYCODONE-ACETAMINOPHEN 5-325 MG PO TABS: 1.0000 | ORAL_TABLET | ORAL | Status: DC | PRN

## 2024-05-10 MED ORDER — IBUPROFEN 800 MG PO TABS
800.0000 mg | ORAL_TABLET | Freq: Once | ORAL | Status: DC
  Filled 2024-05-10: qty 1

## 2024-05-10 MED ORDER — HEPARIN SODIUM (PORCINE) 5000 UNIT/ML IJ SOLN
5000.0000 [IU] | Freq: Three times a day (TID) | INTRAMUSCULAR | Status: DC
  Administered 2024-05-11 – 2024-05-12 (×5): 5000 [IU] via SUBCUTANEOUS
  Filled 2024-05-10 (×5): qty 1

## 2024-05-10 MED ORDER — MORPHINE SULFATE (PF) 4 MG/ML IV SOLN
4.0000 mg | Freq: Once | INTRAVENOUS | Status: AC
  Administered 2024-05-10: 4 mg via INTRAVENOUS
  Filled 2024-05-10: qty 1

## 2024-05-10 NOTE — ED Notes (Signed)
 Unsuccessful blood draw for second set of blood cultures.

## 2024-05-10 NOTE — H&P (Signed)
 " History and Physical    Lori Richards FMW:[id number] DOB: 13-Aug-1985 DOA: 05/10/2024  Referring MD/NP/PA:   PCP: Pcp, No   Patient coming from:  The patient is coming from home.     Chief Complaint: Left facial swelling and pain  HPI: Lori Richards is a 39 y.o. female with medical history significant of eye Mooren's corneal ulcer fibroid, obesity, who presents with left facial swelling and pain  Patient speaks Swahili language.  History is obtained with iPad interpreter's help.  Patient states that her symptoms started 2 days ago, including left facial swelling and pain with painful swallowing.  The swelling is extending to the right submandibular area.  The pain is constant, severe, aching, radiating to the right ear, right neck and the right jaw.  No drooling.  No fever or chills.  No cough, SOB or chest pain.  No nausea, vomiting, diarrhea or abdominal pain.  No symptoms of UTI.  She states that she can swallow liquid diet.     Data reviewed independently and ED Course: pt was found to have WBC 5.0, GFR> 60, negative PCR for COVID, flu and RSV, negative strep A PCR.  Temperature normal, blood pressure 138/80, heart rate 62, RR 18, oxygen saturation 99% on room air.  Patient is placed in PCU for patient.  CT of neck soft tissue: 1. Findings consistent with acute right submandibular sialoadenitis. No obstructive stone or abscess. 2. Asymmetric prominence of right upper cervical lymph nodes, presumably reactive.   EKG: Not done in ED, will get one.   Review of Systems:   General: no fevers, chills, no body weight gain, has poor appetite, has fatigue HEENT: has right facial swelling and pain, painful swallowing Respiratory: no dyspnea, coughing, wheezing CV: no chest pain, no palpitations GI: no nausea, vomiting, abdominal pain, diarrhea, constipation GU: no dysuria, burning on urination, increased urinary frequency, hematuria  Ext: no leg edema Neuro: no  unilateral weakness, numbness, or tingling, no vision change or hearing loss Skin: no rash, no skin tear. MSK: No muscle spasm, no deformity, no limitation of range of movement in spin Heme: No easy bruising.  Travel history: No recent long distant travel.   Allergy: Allergies[1]  Past Medical History:  Diagnosis Date   Mooren's corneal ulcer, right eye    Per IOM Documents   Obesity (BMI 30-39.9)    Uterine fibroid 3.4x3.3x3.2 cm on 03/18/22 u/s 03/24/2022    Past Surgical History:  Procedure Laterality Date   CESAREAN SECTION     C Section 2019 and 2021   CESAREAN SECTION N/A 08/29/2022   Procedure: REPEAT CESAREAN DELIVERY;  Surgeon: Janit Alm Agent, MD;  Location: [facility] ORS;  Service: Obstetrics;  Laterality: N/A;   EYE SURGERY      Social History:  reports that she has never smoked. She has never been exposed to tobacco smoke. She has never used smokeless tobacco. She reports that she does not drink alcohol and does not use drugs.  Family History:  Family History  Problem Relation Age of Onset   Diabetes Father      Prior to Admission medications  Medication Sig Start Date End Date Taking? Authorizing Provider  ibuprofen  (ADVIL ) 600 MG tablet Take 1 tablet (600 mg total) by mouth every 6 (six) hours. 08/31/22   Carlin Rollene HERO, CNM  ibuprofen  (ADVIL ) 800 MG tablet Take 1 tablet (800 mg total) by mouth every 8 (eight) hours as needed. 03/23/24   Menshew, Candida LULLA Kings, PA-C  Prenatal  Vit-Fe Fumarate-FA (PRENATAL VITAMIN) 27-0.8 MG TABS Take 1 tablet by mouth daily. Patient not taking: Reported on 12/09/2022 03/26/22   Eldonna Suzen Octave, MD    Physical Exam: Vitals:   05/10/24 1558 05/10/24 2042 05/10/24 2300 05/11/24 0000  BP:  138/80 132/81 123/80  Pulse:  62 70 78  Resp:  18 16 16   Temp:  98 F (36.7 C) 97.8 F (36.6 C) 97.8 F (36.6 C)  TempSrc:  Oral Oral Oral  SpO2:  99%  100%  Weight: 75 kg     Height: 5' 2 (1.575 m)      General: Not in acute  distress HEENT: Has trismus, limiting examination for oral structure, has right facial swelling and tenderness, extending to the right neck and submandibular area.  No stridor.  Airway is protected.       Eyes: PERRL, EOMI, no jaundice       ENT: No discharge from the ears and nose       Neck: No JVD, no bruit, no mass felt. Heme: has neck lymph node enlargement. Cardiac: S1/S2, RRR, No murmurs, No gallops or rubs. Respiratory: No rales, wheezing, rhonchi or rubs. GI: Soft, nondistended, nontender, no rebound pain, no organomegaly, BS present. GU: No hematuria Ext: No pitting leg edema bilaterally. 1+DP/PT pulse bilaterally. Musculoskeletal: No joint deformities, No joint redness or warmth, no limitation of ROM in spin. Skin: No rashes.  Neuro: Alert, oriented X3, cranial nerves II-XII grossly intact, moves all extremities normally. Psych: Patient is not psychotic, no suicidal or hemocidal ideation.  Labs on Admission: I have personally reviewed following labs and imaging studies  CBC: Recent Labs  Lab 05/10/24 1736  WBC 5.0  HGB 10.3*  HCT 33.6*  MCV 79.1*  PLT 267   Basic Metabolic Panel: Recent Labs  Lab 05/10/24 1736  NA 137  K 3.8  CL 103  CO2 23  GLUCOSE 113*  BUN 10  CREATININE 0.61  CALCIUM 8.8*   GFR: Estimated Creatinine Clearance: 91.3 mL/min (by C-G formula based on SCr of 0.61 mg/dL). Liver Function Tests: Recent Labs  Lab 05/10/24 1736  AST 16  ALT 12  ALKPHOS 100  BILITOT 0.3  PROT 7.2  ALBUMIN 4.1   No results for input(s): LIPASE, AMYLASE in the last 168 hours. No results for input(s): AMMONIA  in the last 168 hours. Coagulation Profile: No results for input(s): INR, PROTIME in the last 168 hours. Cardiac Enzymes: No results for input(s): CKTOTAL, CKMB, CKMBINDEX, TROPONINI in the last 168 hours. BNP (last 3 results) No results for input(s): PROBNP in the last 8760 hours. HbA1C: No results for input(s): HGBA1C in the  last 72 hours. CBG: No results for input(s): GLUCAP in the last 168 hours. Lipid Profile: No results for input(s): CHOL, HDL, LDLCALC, TRIG, CHOLHDL, LDLDIRECT in the last 72 hours. Thyroid Function Tests: No results for input(s): TSH, T4TOTAL, FREET4, T3FREE, THYROIDAB in the last 72 hours. Anemia Panel: No results for input(s): VITAMINB12, FOLATE, FERRITIN, TIBC, IRON, RETICCTPCT in the last 72 hours. Urine analysis:    Component Value Date/Time   COLORURINE YELLOW (A) 03/23/2024 1211   APPEARANCEUR HAZY (A) 03/23/2024 1211   APPEARANCEUR Clear 08/14/2022 0000   LABSPEC 1.016 03/23/2024 1211   PHURINE 5.0 03/23/2024 1211   GLUCOSEU NEGATIVE 03/23/2024 1211   HGBUR NEGATIVE 03/23/2024 1211   BILIRUBINUR NEGATIVE 03/23/2024 1211   BILIRUBINUR Negative 08/14/2022 0000   KETONESUR NEGATIVE 03/23/2024 1211   PROTEINUR NEGATIVE 03/23/2024 1211   NITRITE NEGATIVE  03/23/2024 1211   LEUKOCYTESUR TRACE (A) 03/23/2024 1211   Sepsis Labs: @LABRCNTIP (procalcitonin:4,lacticidven:4) ) Recent Results (from the past 240 hours)  Group A Strep by PCR ([facility] Only)     Status: None   Collection Time: 05/10/24  4:00 PM   Specimen: Throat; Sterile Swab  Result Value Ref Range Status   Group A Strep by PCR NOT DETECTED NOT DETECTED Final    Comment: Performed at [facility], [address] [place], [place] [address]  Resp panel by RT-PCR (RSV, Flu A&B, Covid) Throat     Status: None   Collection Time: 05/10/24  4:00 PM   Specimen: Throat; Nasal Swab  Result Value Ref Range Status   SARS Coronavirus 2 by RT PCR NEGATIVE NEGATIVE Final    Comment: (NOTE) SARS-CoV-2 target nucleic acids are NOT DETECTED.  The SARS-CoV-2 RNA is generally detectable in upper respiratory specimens during the acute phase of infection. The lowest concentration of SARS-CoV-2 viral copies this assay can detect is 138 copies/mL. A negative result does not preclude  SARS-Cov-2 infection and should not be used as the sole basis for treatment or other patient management decisions. A negative result may occur with  improper specimen collection/handling, submission of specimen other than nasopharyngeal swab, presence of viral mutation(s) within the areas targeted by this assay, and inadequate number of viral copies(<138 copies/mL). A negative result must be combined with clinical observations, patient history, and epidemiological information. The expected result is Negative.  Fact Sheet for Patients:  bloggercourse.com  Fact Sheet for Healthcare Providers:  seriousbroker.it  This test is no t yet approved or cleared by the [place]  FDA and  has been authorized for detection and/or diagnosis of SARS-CoV-2 by FDA under an Emergency Use Authorization (EUA). This EUA will remain  in effect (meaning this test can be used) for the duration of the COVID-19 declaration under Section 564(b)(1) of the Act, 21 U.S.C.section 360bbb-3(b)(1), unless the authorization is terminated  or revoked sooner.       Influenza A by PCR NEGATIVE NEGATIVE Final   Influenza B by PCR NEGATIVE NEGATIVE Final    Comment: (NOTE) The Xpert Xpress SARS-CoV-2/FLU/RSV plus assay is intended as an aid in the diagnosis of influenza from Nasopharyngeal swab specimens and should not be used as a sole basis for treatment. Nasal washings and aspirates are unacceptable for Xpert Xpress SARS-CoV-2/FLU/RSV testing.  Fact Sheet for Patients: bloggercourse.com  Fact Sheet for Healthcare Providers: seriousbroker.it  This test is not yet approved or cleared by the [place]  FDA and has been authorized for detection and/or diagnosis of SARS-CoV-2 by FDA under an Emergency Use Authorization (EUA). This EUA will remain in effect (meaning this test can be used) for the duration of  the COVID-19 declaration under Section 564(b)(1) of the Act, 21 U.S.C. section 360bbb-3(b)(1), unless the authorization is terminated or revoked.     Resp Syncytial Virus by PCR NEGATIVE NEGATIVE Final    Comment: (NOTE) Fact Sheet for Patients: bloggercourse.com  Fact Sheet for Healthcare Providers: seriousbroker.it  This test is not yet approved or cleared by the [place]  FDA and has been authorized for detection and/or diagnosis of SARS-CoV-2 by FDA under an Emergency Use Authorization (EUA). This EUA will remain in effect (meaning this test can be used) for the duration of the COVID-19 declaration under Section 564(b)(1) of the Act, 21 U.S.C. section 360bbb-3(b)(1), unless the authorization is terminated or revoked.  Performed at [facility], [address], [place], [place]  [address]      Radiological Exams on Admission:   Assessment/Plan Principal Problem:   Submandibular sialoadenitis Active Problems:   Mooren's corneal ulcer, right eye   Obesity (BMI 30-39.9)   Assessment and Plan:  Submandibular sialoadenitis: No fever or leukocytosis, clinically not septic.  Airway is protected, no drooling. Pt has risk of developing Ludewig angina.  - Admitted to PCU as inpatient - Empiric antimicrobial treatment with Unasyn   - PRN Zofran  for nausea - Pain control: As needed morphine , Percocet, Tylenol  - Blood cultures x 2  - Decadron  10 mg tid  Mooren's corneal ulcer, right eye -Continue home prednisone eyedrops --> will allow pt to use her own medication of eye drops  Obesity (BMI 30-39.9):  Patient has Obesity Class , with body weight 75 Kg and BMI30. 24  kg/m2.  - Encourage losing weight - Exercise and healthy diet     DVT ppx: SQ Heparin     Code Status: Full code   Family Communication:       Yes, patient's brother and 2 sisters  at bed side.      Disposition Plan:  Anticipate discharge  back to previous environment  Consults called: None  Admission status and Level of care: Progressive:    for obs     Dispo: The patient is from: Home              Anticipated d/c is to: Home              Anticipated d/c date is: 1 day              Patient currently is not medically stable to d/c.    Severity of Illness:  The appropriate patient status for this patient is OBSERVATION. Observation status is judged to be reasonable and necessary in order to provide the required intensity of service to ensure the patient's safety. The patient's presenting symptoms, physical exam findings, and initial radiographic and laboratory data in the context of their medical condition is felt to place them at decreased risk for further clinical deterioration. Furthermore, it is anticipated that the patient will be medically stable for discharge from the hospital within 2 midnights of admission.        Date of Service 05/11/2024    Caleb Exon [facility]   If 7PM-7AM, please contact night-coverage [email or web address] 05/11/2024, 1:54 AM     [1] No Known Allergies  "

## 2024-05-10 NOTE — ED Provider Notes (Addendum)
 Patient presenting with submandibular swelling, has no overlying skin changes but is tender on palpation.  Her tongue is not raised.  She is maintaining her secretions, able to range her neck.  CT showed acute right submandibular sialoadenitis.  Given her pain despite pain medications as well as location of her sialadenitis, will start IV antibiotics, IV pain meds.  Will plan to bring her in for further management.   Waymond Lorelle Cummins, MD 05/10/24 2212    Waymond Lorelle Cummins, MD 05/10/24 2213

## 2024-05-10 NOTE — ED Notes (Signed)
 Lori Roys, MD at bedside

## 2024-05-10 NOTE — ED Provider Notes (Signed)
 "  [facility] Provider Note    Event Date/Time   First MD Initiated Contact with Patient 05/10/24 1710     (approximate)   History   Sore Throat   HPI  Lori Richards is a 39 y.o. female  with a past medical history of Mooren's right eye corneal ulcer presents to the emergency department with right-sided facial pain extending to the ear and swelling under her jaw, sore throat, chills, difficulty and painful swallowing that started yesterday. She reports increased pain with opening her jaw. She has not been able to eat without significant pain. No drooling. Denies fevers, eye pain, rhinorrhea, fall, injury, pus-like drainage. No recent travel. She is currently breastfeeding at home.   Virtual Swahili interpreter used in triage.    Physical Exam   Triage Vital Signs: ED Triage Vitals  Encounter Vitals Group     BP 05/10/24 1557 123/65     Girls Systolic BP Percentile --      Girls Diastolic BP Percentile --      Boys Systolic BP Percentile --      Boys Diastolic BP Percentile --      Pulse Rate 05/10/24 1557 79     Resp 05/10/24 1557 18     Temp 05/10/24 1557 98.8 F (37.1 C)     Temp Source 05/10/24 1557 Oral     SpO2 05/10/24 1557 98 %     Weight 05/10/24 1558 165 lb 5.5 oz (75 kg)     Height 05/10/24 1558 5' 2 (1.575 m)     Head Circumference --      Peak Flow --      Pain Score 05/10/24 1557 6     Pain Loc --      Pain Education --      Exclude from Growth Chart --     Most recent vital signs: Vitals:   05/10/24 1557 05/10/24 2042  BP: 123/65 138/80  Pulse: 79 62  Resp: 18 18  Temp: 98.8 F (37.1 C) 98 F (36.7 C)  SpO2: 98% 99%    General: Awake, in no acute distress. Appears stated age. Head: Normocephalic, atraumatic. Eyes: PERRLA.  Ears/Nose/Throat: TMs intact b/l. Nares patent, no nasal discharge. Oropharynx moist, no erythema or exudate. Dentition intact. No fluctuant area or surrounding erythema of any tooth. No  tongue elevation. No uvular deviation. Neck: Right submandibular swelling present, no overlying warmth or erythema. Full ROM. CV: Good peripheral perfusion. RRR, 62 bpm. Respiratory:Normal respiratory effort.  No respiratory distress. CTAB. GI: Soft, non-distended. Skin:Warm, dry, intact. Neurological: A&Ox4 to person, place, time, and situation. Cranial nerves II-XII grossly intact.   ED Results / Procedures / Treatments   Labs (all labs ordered are listed, but only abnormal results are displayed) Labs Reviewed  CBC - Abnormal; Notable for the following components:      Result Value   Hemoglobin 10.3 (*)    HCT 33.6 (*)    MCV 79.1 (*)    MCH 24.2 (*)    RDW 16.0 (*)    All other components within normal limits  COMPREHENSIVE METABOLIC PANEL WITH GFR - Abnormal; Notable for the following components:   Glucose, Bld 113 (*)    Calcium 8.8 (*)    All other components within normal limits  GROUP A STREP BY PCR  RESP PANEL BY RT-PCR (RSV, FLU A&B, COVID)  [id number]  CULTURE, BLOOD (ROUTINE X 2)  CULTURE, BLOOD (ROUTINE X 2)  POC URINE PREG,  ED     EKG    RADIOLOGY CT Soft Tissue Neck FINDINGS: Pharynx and larynx: Oral cavity within normal limits. Oropharynx and nasopharynx overall within normal limits. No retropharyngeal collection or swelling. Negative epiglottis. Hypopharynx and supraglottic larynx within normal limits. Negative eyes. Subglottic airway clear.   Salivary glands: Parotid glands and left submandibular gland within normal limits. Asymmetric enlargement with heterogeneity about the right submandibular gland, consistent with acute sialoadenitis. Associated fluid density with inflammatory changes within the adjacent right submandibular and parapharyngeal spaces. No obstructive stone or abscess.   Thyroid: Normal.   Lymph nodes: Asymmetric prominence of right upper cervical lymph nodes, presumably reactive. Most prominent of the seen at right level 1 B and  measures 9 mm in short axis.   Vascular: Normal intravascular enhancement seen within the neck.   Limited intracranial: Unremarkable.   Visualized orbits: Unremarkable.   Mastoids and visualized paranasal sinuses: Clear/normal.   Skeleton: No discrete or worrisome osseous lesions.   Upper chest: No other acute finding.   Other: None.   IMPRESSION: 1. Findings consistent with acute right submandibular sialoadenitis. No obstructive stone or abscess. 2. Asymmetric prominence of right upper cervical lymph nodes, presumably reactive.   PROCEDURES:  Critical Care performed: No   Procedures   MEDICATIONS ORDERED IN ED: Medications  ibuprofen  (ADVIL ) tablet 800 mg (800 mg Oral Not Given 05/10/24 2259)  Ampicillin -Sulbactam (UNASYN ) 3 g in sodium chloride  0.9 % 100 mL IVPB (3 g Intravenous New Bag/Given 05/10/24 2259)  dexamethasone  (DECADRON ) injection 10 mg (has no administration in time range)  morphine  (PF) 2 MG/ML injection 1 mg (has no administration in time range)  oxyCODONE -acetaminophen  (PERCOCET/ROXICET) 5-325 MG per tablet 1 tablet (has no administration in time range)  ondansetron  (ZOFRAN ) injection 4 mg (has no administration in time range)  acetaminophen  (TYLENOL ) tablet 650 mg (has no administration in time range)  acetaminophen  (TYLENOL ) tablet 1,000 mg (1,000 mg Oral Given 05/10/24 1815)  lidocaine  (XYLOCAINE ) 2 % viscous mouth solution 15 mL (15 mLs Mouth/Throat Given 05/10/24 1815)  oxyCODONE  (Oxy IR/ROXICODONE ) immediate release tablet 5 mg (5 mg Oral Given 05/10/24 1815)  iohexol  (OMNIPAQUE ) 300 MG/ML solution 75 mL (75 mLs Intravenous Contrast Given 05/10/24 2000)  morphine  (PF) 4 MG/ML injection 4 mg (4 mg Intravenous Given 05/10/24 2255)  ondansetron  (ZOFRAN ) injection 4 mg (4 mg Intravenous Given 05/10/24 2255)     IMPRESSION / MDM / ASSESSMENT AND PLAN / ED COURSE  I reviewed the triage vital signs and the nursing notes.                               Differential diagnosis includes, but is not limited to, sialoadenitis, sialolithiasis, dental abscess, strep vs viral pharyngitis, ludwig's angina, RPA, peritonsilar abscess  Patient's presentation is most consistent with acute presentation with potential threat to life or bodily function.  Patient here with right sided facial swelling and pain that started yesterday.  She is moving air well on exam.  She does have a small amount of submandibular swelling on the right side of her face compared to the left. The swelling does not cross the midline of the mandible.  She is afebrile and well-appearing. No tongue elevation. Mucous membranes are moist. No overlying skin warmth or erythema but she is significantly TTP. Given 1 dose of Tylenol  and oxycodone  while in triage extension room. UPT negative.  Respiratory panel and strep PCR ordered in triage are negative.  CBC with stable hemoglobin at 10.3, no white blood cell count elevation.  CMP unremarkable.  CT soft tissue neck with contrast ordered and shows right mandibular sialoadenitis and right upper cervical lymph nodes.  Discussed patient with supervising physician, Dr. Lorelle Cheadle, who also saw the patient. Recommended IV antibiotics and will do IV pain, nausea meds. Patient still breathing normally without distress on exam at this point; seems that submandibular swelling has increased since my initial evaluation 5-6 hours ago. Will consult hospitalist team for admission given the submandibular location of her sialoadenitis with concern of possible airway compromise if anything progresses further.  FINAL CLINICAL IMPRESSION(S) / ED DIAGNOSES   Final diagnoses:  Submandibular sialoadenitis     Rx / DC Orders   ED Discharge Orders     None        Note:  This document was prepared using Dragon voice recognition software and may include unintentional dictation errors.     Sheron Salm, PA-C 05/10/24 2305    Cheadle Lorelle Cummins, MD 05/10/24  2308  "

## 2024-05-10 NOTE — ED Triage Notes (Signed)
 Pt presents to the ED via POV from home. Pt reports sore throat and body aches that started yesterday.

## 2024-05-11 ENCOUNTER — Observation Stay

## 2024-05-11 ENCOUNTER — Encounter: Payer: Self-pay | Admitting: Internal Medicine

## 2024-05-11 ENCOUNTER — Other Ambulatory Visit: Payer: Self-pay

## 2024-05-11 DIAGNOSIS — Z833 Family history of diabetes mellitus: Secondary | ICD-10-CM | POA: Diagnosis not present

## 2024-05-11 DIAGNOSIS — Z59868 Other specified financial insecurity: Secondary | ICD-10-CM | POA: Diagnosis not present

## 2024-05-11 DIAGNOSIS — E1165 Type 2 diabetes mellitus with hyperglycemia: Secondary | ICD-10-CM | POA: Diagnosis present

## 2024-05-11 DIAGNOSIS — E66811 Obesity, class 1: Secondary | ICD-10-CM | POA: Diagnosis present

## 2024-05-11 DIAGNOSIS — T380X5A Adverse effect of glucocorticoids and synthetic analogues, initial encounter: Secondary | ICD-10-CM | POA: Diagnosis present

## 2024-05-11 DIAGNOSIS — K1121 Acute sialoadenitis: Secondary | ICD-10-CM | POA: Diagnosis present

## 2024-05-11 DIAGNOSIS — Z1152 Encounter for screening for COVID-19: Secondary | ICD-10-CM | POA: Diagnosis not present

## 2024-05-11 DIAGNOSIS — Z98891 History of uterine scar from previous surgery: Secondary | ICD-10-CM | POA: Diagnosis not present

## 2024-05-11 DIAGNOSIS — H16001 Unspecified corneal ulcer, right eye: Secondary | ICD-10-CM | POA: Diagnosis present

## 2024-05-11 DIAGNOSIS — Z6830 Body mass index (BMI) 30.0-30.9, adult: Secondary | ICD-10-CM | POA: Diagnosis not present

## 2024-05-11 DIAGNOSIS — R1310 Dysphagia, unspecified: Secondary | ICD-10-CM | POA: Diagnosis present

## 2024-05-11 DIAGNOSIS — K1120 Sialoadenitis, unspecified: Secondary | ICD-10-CM | POA: Diagnosis present

## 2024-05-11 LAB — CBC
HCT: 35.9 % — ABNORMAL LOW (ref 36.0–46.0)
Hemoglobin: 11.2 g/dL — ABNORMAL LOW (ref 12.0–15.0)
MCH: 24.3 pg — ABNORMAL LOW (ref 26.0–34.0)
MCHC: 31.2 g/dL (ref 30.0–36.0)
MCV: 77.9 fL — ABNORMAL LOW (ref 80.0–100.0)
Platelets: 298 K/uL (ref 150–400)
RBC: 4.61 MIL/uL (ref 3.87–5.11)
RDW: 16.1 % — ABNORMAL HIGH (ref 11.5–15.5)
WBC: 7.1 K/uL (ref 4.0–10.5)
nRBC: 0 % (ref 0.0–0.2)

## 2024-05-11 LAB — BASIC METABOLIC PANEL WITH GFR
Anion gap: 10 (ref 5–15)
BUN: 8 mg/dL (ref 6–20)
CO2: 23 mmol/L (ref 22–32)
Calcium: 9 mg/dL (ref 8.9–10.3)
Chloride: 103 mmol/L (ref 98–111)
Creatinine, Ser: 0.67 mg/dL (ref 0.44–1.00)
GFR, Estimated: >60 mL/min
Glucose, Bld: 228 mg/dL — ABNORMAL HIGH (ref 70–99)
Potassium: 3.9 mmol/L (ref 3.5–5.1)
Sodium: 135 mmol/L (ref 135–145)

## 2024-05-11 LAB — GLUCOSE, CAPILLARY: Glucose-Capillary: 178 mg/dL — ABNORMAL HIGH (ref 70–99)

## 2024-05-11 LAB — CBG MONITORING, ED
Glucose-Capillary: 202 mg/dL — ABNORMAL HIGH (ref 70–99)
Glucose-Capillary: 220 mg/dL — ABNORMAL HIGH (ref 70–99)

## 2024-05-11 MED ORDER — SODIUM CHLORIDE 0.9 % IV SOLN
3.0000 g | Freq: Four times a day (QID) | INTRAVENOUS | Status: DC
  Administered 2024-05-11 – 2024-05-12 (×4): 3 g via INTRAVENOUS
  Filled 2024-05-11 (×6): qty 8

## 2024-05-11 MED ORDER — INSULIN ASPART 100 UNIT/ML IJ SOLN
0.0000 [IU] | Freq: Three times a day (TID) | INTRAMUSCULAR | Status: DC
  Administered 2024-05-11: 2 [IU] via SUBCUTANEOUS
  Administered 2024-05-11: 3 [IU] via SUBCUTANEOUS
  Administered 2024-05-12 (×2): 2 [IU] via SUBCUTANEOUS
  Filled 2024-05-11 (×2): qty 2
  Filled 2024-05-11: qty 3
  Filled 2024-05-11: qty 2

## 2024-05-11 NOTE — Progress Notes (Signed)
" ° °  Brief Progress Note   _____________________________________________________________________________________________________________  Patient Name: Lori Richards Patient DOB: 21-Feb-1986 Date: @TODAY @      Data: 39 y.o. female currently awaiting admission to a Progressive bed at [facility].     Action: I reached out to Dr. Jens to inquire about the possibility of downgrading the patient to a med-surg level of care.    Response:  Awaiting follow up response.  _____________________________________________________________________________________________________________  [facility] RN Expeditor [name] Please contact us  directly via secure chat (search for [facility]) or by calling us  at [phone] [facility]).  "

## 2024-05-11 NOTE — Progress Notes (Addendum)
 "    Progress Note    Lori Richards  FMW:[id number] DOB: [birth year]  DOA: 05/10/2024 PCP: Pcp, No      Brief Narrative:    Medical records reviewed and are as summarized below:  Lori Richards is a 39 y.o. female with medical history significant for Mooren corneal ulcer, fibroid uterus, obesity, who presented to the hospital because of painful swelling of the right side of the face.  Symptoms started about 2 days prior to admission.   CT soft tissue of the neck IMPRESSION: 1. Findings consistent with acute right submandibular sialoadenitis. No obstructive stone or abscess. 2. Asymmetric prominence of right upper cervical lymph nodes, presumably reactive.      Assessment/Plan:   Principal Problem:   Submandibular sialoadenitis Active Problems:   Mooren's corneal ulcer, right eye   Obesity (BMI 30-39.9)    Body mass index is 30.24 kg/m.  (Class I obesity)   Acute right submandibular sialoadenitis, painful swelling of right  jaw: Patient has intractable pain on the right side of the face.  Continue IV Unasyn  and IV dexamethasone .  Analgesics as needed for pain.   Acute altered mental status: Rapid response was called this morning (05/11/2024 around 8:40 AM) because of acute change in mental status.  Patient was found minimally responsive and rapid response was called.  Vital signs were stable.  CT head without contrast did not show any acute abnormality.  Etiology of altered mental status is unclear but it could be related to morphine .  Patient had received IV morphine  at 5:24 AM on 05/11/2024.   Dizziness and general weakness: Probably related to opioid analgesics.   Hyperglycemia: Likely related to steroids.  Check hemoglobin A1c. NovoLog  as needed for hyperglycemia. Hemoglobin A1c in November 2023 was 5.9.   Mooren's corneal ulcer, right eye: Continue prednisone eyedrops   Plan of care was discussed with patient and her husband at the bedside  using portable iPad interpreter.  Maryelizabeth with ID number [phone] was interpreter for this encounter.   Diet Order             Diet full liquid Room service appropriate? Yes; Fluid consistency: Thin  Diet effective now                                  Consultants: None  Procedures: None    Medications:    dexamethasone  (DECADRON ) injection  10 mg Intravenous TID   heparin   5,000 Units Subcutaneous Q8H   ibuprofen   800 mg Oral Once   Continuous Infusions:  ampicillin -sulbactam (UNASYN ) IV       Anti-infectives (From admission, onward)    Start     Dose/Rate Route Frequency Ordered Stop   05/11/24 0900  Ampicillin -Sulbactam (UNASYN ) 3 g in sodium chloride  0.9 % 100 mL IVPB        3 g 200 mL/hr over 30 Minutes Intravenous Every 6 hours 05/11/24 0854     05/10/24 2230  Ampicillin -Sulbactam (UNASYN ) 3 g in sodium chloride  0.9 % 100 mL IVPB        3 g 200 mL/hr over 30 Minutes Intravenous  Once 05/10/24 2227 05/10/24 2329              Family Communication/Anticipated D/C date and plan/Code Status   DVT prophylaxis: heparin  injection 5,000 Units Start: 05/10/24 2315     Code Status: Full Code  Family Communication: Plan discussed with the husband at the  bedside Disposition Plan: Plan to discharge home   Status is: Observation The patient will require care spanning > 2 midnights and should be moved to inpatient because: Intractable right jaw pain, altered mental status       Subjective:   Interval events noted.  Rapid response was called this morning because of acute altered mental status.  Patient was found minimally responsive.  At the time of this encounter, she is alert and oriented and able to answer questions appropriately.  Husband at the bedside.  She complains of severe pain under right jaw.  She also has difficulty swallowing and painful swallowing.  Objective:    Vitals:   05/11/24 0615 05/11/24 0630 05/11/24 0645  05/11/24 0700  BP:  125/82  113/74  Pulse: 74 83 77 82  Resp:    18  Temp:      TempSrc:      SpO2: 100% 100% 100% 100%  Weight:      Height:       No data found.  No intake or output data in the 24 hours ending 05/11/24 1013 Filed Weights   05/10/24 1558  Weight: 75 kg    Exam:  GEN: NAD SKIN: Warm and dry EYES: No pallor or icterus.  Abnormal right eye ENT: MMM, significant tenderness and swelling of the right jaw.  She is unable to open her mouth fully for adequate oral exam.  No pharyngeal exudates or erythema noted within the limits of the exam. CV: RRR PULM: CTA B ABD: soft, obese, NT, +BS CNS: AAO x 3, non focal EXT: No edema or tenderness       Data Reviewed:   I have personally reviewed following labs and imaging studies:  Labs: Labs show the following:   Basic Metabolic Panel: Recent Labs  Lab 05/10/24 1736 05/11/24 0520  NA 137 135  K 3.8 3.9  CL 103 103  CO2 23 23  GLUCOSE 113* 228*  BUN 10 8  CREATININE 0.61 0.67  CALCIUM 8.8* 9.0   GFR Estimated Creatinine Clearance: 91.3 mL/min (by C-G formula based on SCr of 0.67 mg/dL). Liver Function Tests: Recent Labs  Lab 05/10/24 1736  AST 16  ALT 12  ALKPHOS 100  BILITOT 0.3  PROT 7.2  ALBUMIN 4.1   No results for input(s): LIPASE, AMYLASE in the last 168 hours. No results for input(s): AMMONIA  in the last 168 hours. Coagulation profile No results for input(s): INR, PROTIME in the last 168 hours.  CBC: Recent Labs  Lab 05/10/24 1736 05/11/24 0520  WBC 5.0 7.1  HGB 10.3* 11.2*  HCT 33.6* 35.9*  MCV 79.1* 77.9*  PLT 267 298   Cardiac Enzymes: No results for input(s): CKTOTAL, CKMB, CKMBINDEX, TROPONINI in the last 168 hours. BNP (last 3 results) No results for input(s): PROBNP in the last 8760 hours. CBG: Recent Labs  Lab 05/11/24 0836  GLUCAP 202*   D-Dimer: No results for input(s): DDIMER in the last 72 hours. Hgb A1c: No results for input(s):  HGBA1C in the last 72 hours. Lipid Profile: No results for input(s): CHOL, HDL, LDLCALC, TRIG, CHOLHDL, LDLDIRECT in the last 72 hours. Thyroid function studies: No results for input(s): TSH, T4TOTAL, T3FREE, THYROIDAB in the last 72 hours.  Invalid input(s): FREET3 Anemia work up: No results for input(s): VITAMINB12, FOLATE, FERRITIN, TIBC, IRON, RETICCTPCT in the last 72 hours. Sepsis Labs: Recent Labs  Lab 05/10/24 1736 05/11/24 0520  WBC 5.0 7.1    Microbiology Recent Results (from  the past 240 hours)  Group A Strep by PCR ([facility] Only)     Status: None   Collection Time: 05/10/24  4:00 PM   Specimen: Throat; Sterile Swab  Result Value Ref Range Status   Group A Strep by PCR NOT DETECTED NOT DETECTED Final    Comment: Performed at [facility], [address] [place], [place] [address]  Resp panel by RT-PCR (RSV, Flu A&B, Covid) Throat     Status: None   Collection Time: 05/10/24  4:00 PM   Specimen: Throat; Nasal Swab  Result Value Ref Range Status   SARS Coronavirus 2 by RT PCR NEGATIVE NEGATIVE Final    Comment: (NOTE) SARS-CoV-2 target nucleic acids are NOT DETECTED.  The SARS-CoV-2 RNA is generally detectable in upper respiratory specimens during the acute phase of infection. The lowest concentration of SARS-CoV-2 viral copies this assay can detect is 138 copies/mL. A negative result does not preclude SARS-Cov-2 infection and should not be used as the sole basis for treatment or other patient management decisions. A negative result may occur with  improper specimen collection/handling, submission of specimen other than nasopharyngeal swab, presence of viral mutation(s) within the areas targeted by this assay, and inadequate number of viral copies(<138 copies/mL). A negative result must be combined with clinical observations, patient history, and epidemiological information. The expected result is Negative.  Fact Sheet  for Patients:  bloggercourse.com  Fact Sheet for Healthcare Providers:  seriousbroker.it  This test is no t yet approved or cleared by the [place]  FDA and  has been authorized for detection and/or diagnosis of SARS-CoV-2 by FDA under an Emergency Use Authorization (EUA). This EUA will remain  in effect (meaning this test can be used) for the duration of the COVID-19 declaration under Section 564(b)(1) of the Act, 21 U.S.C.section 360bbb-3(b)(1), unless the authorization is terminated  or revoked sooner.       Influenza A by PCR NEGATIVE NEGATIVE Final   Influenza B by PCR NEGATIVE NEGATIVE Final    Comment: (NOTE) The Xpert Xpress SARS-CoV-2/FLU/RSV plus assay is intended as an aid in the diagnosis of influenza from Nasopharyngeal swab specimens and should not be used as a sole basis for treatment. Nasal washings and aspirates are unacceptable for Xpert Xpress SARS-CoV-2/FLU/RSV testing.  Fact Sheet for Patients: bloggercourse.com  Fact Sheet for Healthcare Providers: seriousbroker.it  This test is not yet approved or cleared by the [place]  FDA and has been authorized for detection and/or diagnosis of SARS-CoV-2 by FDA under an Emergency Use Authorization (EUA). This EUA will remain in effect (meaning this test can be used) for the duration of the COVID-19 declaration under Section 564(b)(1) of the Act, 21 U.S.C. section 360bbb-3(b)(1), unless the authorization is terminated or revoked.     Resp Syncytial Virus by PCR NEGATIVE NEGATIVE Final    Comment: (NOTE) Fact Sheet for Patients: bloggercourse.com  Fact Sheet for Healthcare Providers: seriousbroker.it  This test is not yet approved or cleared by the [place]  FDA and has been authorized for detection and/or diagnosis of SARS-CoV-2 by FDA under an  Emergency Use Authorization (EUA). This EUA will remain in effect (meaning this test can be used) for the duration of the COVID-19 declaration under Section 564(b)(1) of the Act, 21 U.S.C. section 360bbb-3(b)(1), unless the authorization is terminated or revoked.  Performed at [facility], [address] [place], [place] [address]   Culture, blood (Routine X 2) w Reflex to ID Panel     Status:  None (Preliminary result)   Collection Time: 05/10/24 11:11 PM   Specimen: BLOOD  Result Value Ref Range Status   Specimen Description BLOOD LEFT ANTECUBITAL  Final   Special Requests   Final    BOTTLES DRAWN AEROBIC AND ANAEROBIC Blood Culture adequate volume   Culture   Final    NO GROWTH < 12 HOURS Performed at [facility], [address], [place], [place] [address]    Report Status PENDING  Incomplete    Procedures and diagnostic studies:  CT Soft Tissue Neck W Contrast Result Date: 05/10/2024 CLINICAL DATA:  Initial evaluation for acute infection. EXAM: CT NECK WITH CONTRAST TECHNIQUE: Multidetector CT imaging of the neck was performed using the standard protocol following the bolus administration of intravenous contrast. RADIATION DOSE REDUCTION: This exam was performed according to the departmental dose-optimization program which includes automated exposure control, adjustment of the mA and/or kV according to patient size and/or use of iterative reconstruction technique. CONTRAST:  75mL OMNIPAQUE  IOHEXOL  300 MG/ML  SOLN COMPARISON:  None Available. FINDINGS: Pharynx and larynx: Oral cavity within normal limits. Oropharynx and nasopharynx overall within normal limits. No retropharyngeal collection or swelling. Negative epiglottis. Hypopharynx and supraglottic larynx within normal limits. Negative eyes. Subglottic airway clear. Salivary glands: Parotid glands and left submandibular gland within normal limits. Asymmetric enlargement with heterogeneity about the right  submandibular gland, consistent with acute sialoadenitis. Associated fluid density with inflammatory changes within the adjacent right submandibular and parapharyngeal spaces. No obstructive stone or abscess. Thyroid: Normal. Lymph nodes: Asymmetric prominence of right upper cervical lymph nodes, presumably reactive. Most prominent of the seen at right level 1 B and measures 9 mm in short axis. Vascular: Normal intravascular enhancement seen within the neck. Limited intracranial: Unremarkable. Visualized orbits: Unremarkable. Mastoids and visualized paranasal sinuses: Clear/normal. Skeleton: No discrete or worrisome osseous lesions. Upper chest: No other acute finding. Other: None. IMPRESSION: 1. Findings consistent with acute right submandibular sialoadenitis. No obstructive stone or abscess. 2. Asymmetric prominence of right upper cervical lymph nodes, presumably reactive. Electronically Signed   By: Morene Hoard M.D.   On: 05/10/2024 21:39               LOS: 0 days   Philisha Weinel  [facility]   Pager on [email or web address]. If 7PM-7AM, please contact night-coverage at [email or web address]     05/11/2024, 10:13 AM           "

## 2024-05-11 NOTE — Progress Notes (Signed)
 SPIRITUAL CARE AND COUNSELING CONSULT NOTE   VISIT SUMMARY  Husband, Eva, at bedside. Interpreter needed.  SPIRITUAL ENCOUNTER                                                                                                                                                                      Type of Visit: Initial Care provided to:: Pt and family Conversation partners present during encounter: Nurse, Physician, Other (comment) (Interpreter on screen) Referral source: Code page Reason for visit: Code OnCall Visit: Yes   SPIRITUAL FRAMEWORK      GOALS       INTERVENTIONS   Spiritual Care Interventions Made: Established relationship of care and support, Compassionate presence, Prayer, Encouragement    INTERVENTION OUTCOMES   Outcomes: Connection to spiritual care, Awareness of support  SPIRITUAL CARE PLAN   Spiritual Care Issues Still Outstanding: No further spiritual care needs at this time (see row info)    If immediate needs arise, please contact [facility] 24 hour on call [phone]   Sari Hugh, Chaplain  05/11/2024 9:03 AM

## 2024-05-11 NOTE — Plan of Care (Signed)
   Problem: Coping: Goal: Ability to adjust to condition or change in health will improve Outcome: Progressing   Problem: Fluid Volume: Goal: Ability to maintain a balanced intake and output will improve Outcome: Progressing   Problem: Health Behavior/Discharge Planning: Goal: Ability to identify and utilize available resources and services will improve Outcome: Progressing

## 2024-05-11 NOTE — ED Provider Notes (Signed)
" ° ° °  EKG inter by me at 9 heart rate 75 QRS 80 QTc 440 Normal sinus rhythm no evidence of acute abnormality  Blood glucose is 202  CT Head Wo Contrast Result Date: 05/11/2024 CLINICAL DATA:  Bloody nose and weakness. EXAM: CT HEAD WITHOUT CONTRAST TECHNIQUE: Contiguous axial images were obtained from the base of the skull through the vertex without intravenous contrast. RADIATION DOSE REDUCTION: This exam was performed according to the departmental dose-optimization program which includes automated exposure control, adjustment of the mA and/or kV according to patient size and/or use of iterative reconstruction technique. COMPARISON:  None Available. FINDINGS: Brain: No evidence of acute infarction, hemorrhage, hydrocephalus, extra-axial collection or mass lesion/mass effect. Vascular: No hyperdense vessel or unexpected calcification. Skull: Normal. Negative for fracture or focal lesion. Sinuses/Orbits: No acute finding. Other: None. IMPRESSION: No acute intracranial pathology. Electronically Signed   By: Toribio Agreste M.D.   On: 05/11/2024 10:49    Dicky Anes, MD 05/11/24 1546  "

## 2024-05-11 NOTE — ED Provider Notes (Signed)
 [facility] Department of Emergency Medicine  Rapid response  Chief Complaint: Unresponsive  Level V Caveat: Unresponsive  History of present illness: I was contacted by the hospital for a rapid response for patient in the ER currently admitted to the hospitalist service    ROS: Unable to obtain, Level V caveat  Scheduled Meds:  dexamethasone  (DECADRON ) injection  10 mg Intravenous TID   heparin   5,000 Units Subcutaneous Q8H   ibuprofen   800 mg Oral Once   Continuous Infusions: PRN Meds:.acetaminophen , morphine  injection, ondansetron  (ZOFRAN ) IV, oxyCODONE -acetaminophen  Past Medical History:  Diagnosis Date   Mooren's corneal ulcer, right eye    Per IOM Documents   Obesity (BMI 30-39.9)    Uterine fibroid 3.4x3.3x3.2 cm on 03/18/22 u/s 03/24/2022   Past Surgical History:  Procedure Laterality Date   CESAREAN SECTION     C Section 2019 and 2021   CESAREAN SECTION N/A 08/29/2022   Procedure: REPEAT CESAREAN DELIVERY;  Surgeon: Janit Alm Agent, MD;  Location: [facility] ORS;  Service: Obstetrics;  Laterality: N/A;   EYE SURGERY     Social History   Socioeconomic History   Marital status: Single    Spouse name: Eva   Number of children: 2   Years of education: 12   Highest education level: High school graduate  Occupational History    Comment: [employer]  Tobacco Use   Smoking status: Never    Passive exposure: Never   Smokeless tobacco: Never  Vaping Use   Vaping status: Never Used  Substance and Sexual Activity   Alcohol use: Never   Drug use: Never   Sexual activity: Not Currently    Partners: Male    Birth control/protection: None  Other Topics Concern   Not on file  Social History Narrative   ** Merged History Encounter **       ** Merged History Encounter **       Lives with husband and their 2 children and his daughter from another relationship.   Social Drivers of Health   Tobacco Use: Low Risk (05/10/2024)   Patient  History    Smoking Tobacco Use: Never    Smokeless Tobacco Use: Never    Passive Exposure: Never  Financial Resource Strain: Medium Risk (02/27/2022)   Overall Financial Resource Strain (CARDIA)    Difficulty of Paying Living Expenses: Somewhat hard  Food Insecurity: No Food Insecurity (08/29/2022)   Hunger Vital Sign    Worried About Running Out of Food in the Last Year: Never true    Ran Out of Food in the Last Year: Never true  Transportation Needs: No Transportation Needs (08/29/2022)   PRAPARE - Administrator, Civil Service (Medical): No    Lack of Transportation (Non-Medical): No  Physical Activity: Not on file  Stress: Not on file  Social Connections: Not on file  Intimate Partner Violence: Not At Risk (08/29/2022)   Humiliation, Afraid, Rape, and Kick questionnaire    Fear of Current or Ex-Partner: No    Emotionally Abused: No    Physically Abused: No    Sexually Abused: No  Depression (PHQ2-9): Low Risk (08/21/2022)   Depression (PHQ2-9)    PHQ-2 Score: 3  Alcohol Screen: Not on file  Housing: Low Risk (02/27/2022)   Housing    Last Housing Risk Score: 0  Utilities: Not At Risk (08/29/2022)   AHC Utilities    Threatened with loss of utilities: No  Health Literacy: Not on file  Allergies[1]  Last set of Vital Signs (not current) Vitals:   05/11/24 0645 05/11/24 0700  BP:  113/74  Pulse: 77 82  Resp:  18  Temp:    SpO2: 100% 100%      Physical Exam  Gen: unresponsive.  Occasionally blinking eyes.  Warm and well-perfused.  Does not appear in distress. Cardiovascular: Strong pulses Resp: Normal respiratory pattern.  Swallowing secretions handling secretions well without difficulty no coughing lung sounds clear bilateral Abd: nondistended  Neuro: Occasionally will lift her eyelids, occasionally moves his left arm or left leg.  After couple minutes she does open her eyes and will follow commands but seems extremely fatigued and weak in all extremities but  in no distress Musculoskeletal: No deformity  Skin: warm  Procedures (when applicable, including Critical Care time): Procedures   MDM / Assessment and Plan Patient with stable reassuring hemodynamics.  Depressed mental status with fatigue and weakness.  After a few minutes and with use of the Swahili interpreter she does open her eyes, but reports she feels dizzy and very weak.  No rash no wheezing vital signs are essentially normal except for slight tachycardia.  I will order a CT of the head and EKG.  Dr. Jens, and is aware and following up on further as per the hospitalist team.  She is not in acute cardiac arrest or extreme respiratory extremis.  Will obtain CT of the head to further evaluate for change mental status defer remaining workup to the hospitalist who is agreeable     [1] No Known Allergies    Dicky Anes, MD 05/11/24 0930

## 2024-05-12 ENCOUNTER — Other Ambulatory Visit: Payer: Self-pay

## 2024-05-12 DIAGNOSIS — K1120 Sialoadenitis, unspecified: Secondary | ICD-10-CM | POA: Diagnosis not present

## 2024-05-12 DIAGNOSIS — E1165 Type 2 diabetes mellitus with hyperglycemia: Secondary | ICD-10-CM | POA: Insufficient documentation

## 2024-05-12 LAB — GLUCOSE, CAPILLARY
Glucose-Capillary: 173 mg/dL — ABNORMAL HIGH (ref 70–99)
Glucose-Capillary: 178 mg/dL — ABNORMAL HIGH (ref 70–99)

## 2024-05-12 LAB — HIV ANTIBODY (ROUTINE TESTING W REFLEX): HIV Screen 4th Generation wRfx: NONREACTIVE

## 2024-05-12 LAB — APTT: aPTT: 25 s (ref 24–36)

## 2024-05-12 LAB — PROTIME-INR
INR: 1.1 (ref 0.8–1.2)
Prothrombin Time: 14.5 s (ref 11.4–15.2)

## 2024-05-12 LAB — HEMOGLOBIN A1C
Hgb A1c MFr Bld: 7.7 % — ABNORMAL HIGH (ref 4.8–5.6)
Mean Plasma Glucose: 174.29 mg/dL

## 2024-05-12 MED ORDER — BLOOD GLUCOSE TEST VI STRP
1.0000 | ORAL_STRIP | 0 refills | Status: AC
  Filled 2024-05-12: qty 100, 25d supply, fill #0

## 2024-05-12 MED ORDER — LANCET DEVICE MISC
1.0000 | 0 refills | Status: AC
  Filled 2024-05-12: qty 1, fill #0

## 2024-05-12 MED ORDER — ACETAMINOPHEN 325 MG PO TABS: 650.0000 mg | ORAL_TABLET | Freq: Four times a day (QID) | ORAL | Status: AC | PRN

## 2024-05-12 MED ORDER — IBUPROFEN 600 MG PO TABS
600.0000 mg | ORAL_TABLET | Freq: Three times a day (TID) | ORAL | 0 refills | Status: AC | PRN
  Filled 2024-05-12: qty 10, 4d supply, fill #0

## 2024-05-12 MED ORDER — DEXAMETHASONE SODIUM PHOSPHATE 4 MG/ML IJ SOLN
4.0000 mg | Freq: Three times a day (TID) | INTRAMUSCULAR | Status: DC
  Administered 2024-05-12: 4 mg via INTRAVENOUS
  Filled 2024-05-12: qty 1

## 2024-05-12 MED ORDER — LANCETS MISC
1.0000 | 0 refills | Status: AC
  Filled 2024-05-12: qty 100, 25d supply, fill #0

## 2024-05-12 MED ORDER — METFORMIN HCL ER 500 MG PO TB24
500.0000 mg | ORAL_TABLET | Freq: Every day | ORAL | 1 refills | Status: AC
  Filled 2024-05-12: qty 30, 30d supply, fill #0

## 2024-05-12 MED ORDER — ENSURE PLUS HIGH PROTEIN PO LIQD
237.0000 mL | Freq: Two times a day (BID) | ORAL | Status: DC
  Administered 2024-05-12: 237 mL via ORAL

## 2024-05-12 MED ORDER — AMOXICILLIN-POT CLAVULANATE 875-125 MG PO TABS
1.0000 | ORAL_TABLET | Freq: Two times a day (BID) | ORAL | 0 refills | Status: AC
  Filled 2024-05-12: qty 12, 6d supply, fill #0

## 2024-05-12 MED ORDER — METFORMIN HCL ER 500 MG PO TB24
500.0000 mg | ORAL_TABLET | Freq: Every day | ORAL | 0 refills | Status: DC
  Filled 2024-05-12: qty 30, 30d supply, fill #0

## 2024-05-12 MED ORDER — BLOOD GLUCOSE MONITOR SYSTEM W/DEVICE KIT
1.0000 | PACK | 0 refills | Status: AC
  Filled 2024-05-12: qty 1, 1d supply, fill #0

## 2024-05-12 NOTE — Inpatient Diabetes Management (Signed)
 Inpatient Diabetes Program Recommendations  AACE/ADA: New Consensus Statement on Inpatient Glycemic Control (2015)  Target Ranges:  Prepandial:   less than 140 mg/dL      Peak postprandial:   less than 180 mg/dL (1-2 hours)      Critically ill patients:  140 - 180 mg/dL   Lab Results  Component Value Date   GLUCAP 178 (H) 05/12/2024   HGBA1C 7.7 (H) 05/11/2024    Review of Glycemic Control  Diabetes history: None--New diagnosis this admission  Outpatient Diabetes medications: None  Current orders for Inpatient glycemic control: Novolog  Sensitive Correction Scale/ SSI (0-9 units) TID AC     Received consult for new diagnosis diabetes.  Pt speaks Swahili so I used the interpreter iPad Valdene [phone]).  Current A1c= 7.7%.  Will d/c home on Metformin  500 mg daily.  Asked RN to please contact MD to ask for 3mos Rx for the Metformin  since pt's new pt apt with new PCP not until 07/29/2023.  Pt reported to me that she had sugar problems in 2023 when she arrived to the [place] .  Was not given any medication--She thinks her A1c was in the 9% range.  Drinks tonic water, passion fruit juice, eats lots of starchy foods like yams, cassava, rice, plantains.  Does eat protein like fish, goat, and beef.    Spoke with pt about new diagnosis.  Discussed A1C results with pt and her husband and explained what an A1C is, basic pathophysiology of DM Type 2, basic home care, basic diabetes diet nutrition principles, importance of checking CBGs and maintaining good CBG control to prevent long-term and short-term complications.  Also reviewed blood sugar goals and A1c goals for home.    Also discussed DM diet information with patient.  Encouraged patient to avoid beverages with sugar (regular tonic water, regular soda, sweet tea, lemonade, fruit juice) and to consume mostly water.  Discussed what foods contain carbohydrates and how carbohydrates affect the body's blood sugar levels.  Encouraged patient to be careful  with her portion sizes (especially grains, starchy vegetables, and fruits).      --Will follow patient during hospitalization--  Adina Rudolpho Arrow RN, MSN, CDCES Diabetes Coordinator Inpatient Glycemic Control Team Team Pager: [phone] (8a-5p)

## 2024-05-12 NOTE — Discharge Summary (Signed)
 " Physician Discharge Summary   Patient: Lori Richards MRN: [id number] DOB: 1985-10-19  Admit date:     05/10/2024  Discharge date: 05/12/24  Discharge Physician: AIDA CHO   PCP: Pcp, No   Recommendations at discharge:   Follow-up with PCP as soon as possible.  Discharge Diagnoses: Principal Problem:   Submandibular sialoadenitis Active Problems:   Mooren's corneal ulcer, right eye   Obesity (BMI 30-39.9)   Type 2 diabetes mellitus with hyperglycemia (HCC)  Resolved Problems:   * No resolved hospital problems. *  Hospital Course:  Lori Richards is a 39 y.o. female with medical history significant for Mooren corneal ulcer, fibroid uterus, obesity, who presented to the hospital because of painful swelling of the right side of the face.  Symptoms started about 2 days prior to admission.     CT soft tissue of the neck IMPRESSION: 1. Findings consistent with acute right submandibular sialoadenitis. No obstructive stone or abscess. 2. Asymmetric prominence of right upper cervical lymph nodes, presumably reactive.    Assessment and Plan:   Acute right submandibular sialoadenitis, painful swelling of right  jaw: This has improved significantly.  Jaw pain has resolved.  She has been able to eat and swallow without any pain or difficulty. She will be discharged on Augmentin . S/p treatment with IV Unasyn  and IV dexamethasone . She was also treated with analgesics including IV morphine      Acute altered mental status: Rapid response was called on 05/11/2024 around 8:40 AM because of acute change in mental status.  Patient was found minimally responsive. Vital signs were stable.  CT head without contrast did not show any acute abnormality.  Etiology of altered mental status is unclear but it could be related to morphine .  Patient had received IV morphine  at 5:24 AM on 05/11/2024.     Dizziness and general weakness: Resolved.  Probably related to opioid  analgesics.     Type II DM with hyperglycemia: Hemoglobin A1c was 7.7. Hemoglobin A1c in November 2023 was 5.9. She said she does not have any history of diabetes mellitus and is not aware of her borderline history of diabetes mellitus.  She was educated on the management and complications of diabetes mellitus.  Treatment options were discussed and patient agreed to start metformin .  Potential side effects of metformin  were discussed as well. She was also educated on the importance of lifestyle changes including healthy eating habits, avoiding sugary foods and drinks, regular exercise and weight loss.  The importance of glucose monitoring and ongoing management with PCP was reiterated. She was given a prescription for glucometer, glucometer strips and lancets.   Diabetic coordinator was also consulted for additional education and management.   Mooren's corneal ulcer, right eye: Continue prednisone eyedrops    Her condition has improved significantly and she is deemed stable for discharge today.  Portable iPad remote interpreter was used for this encounter.  We were unable to connect with a Swahili interpreter so patient requested a French interpreter via the iPad remote interpreter.        Consultants: None Procedures performed: None Disposition: Home Diet recommendation:  Discharge Diet Orders (From admission, onward)     Start     Ordered   05/12/24 0000  Diet Carb Modified        05/12/24 1211           Cardiac and Carb modified diet DISCHARGE MEDICATION: Allergies as of 05/12/2024   No Known Allergies      Medication List  STOP taking these medications    Prenatal Vitamin 27-0.8 MG Tabs       TAKE these medications    Accu-Chek Guide Test test strip Generic drug: glucose blood Use up to four times daily as directed.   Accu-Chek Guide w/Device Kit Use up to four times daily as directed.   Accu-Chek Softclix Lancets lancets 1 each by Does not  apply route as directed. Dispense based on patient and insurance preference. Use up to four times daily as directed. (FOR ICD-10 E10.9, E11.9).   acetaminophen  325 MG tablet Commonly known as: TYLENOL  Take 2 tablets (650 mg total) by mouth every 6 (six) hours as needed for mild pain (pain score 1-3).   amoxicillin -clavulanate 875-125 MG tablet Commonly known as: AUGMENTIN  Take 1 tablet by mouth 2 (two) times daily for 6 days.   ibuprofen  600 MG tablet Commonly known as: ADVIL  Take 1 tablet (600 mg total) by mouth every 8 (eight) hours as needed. What changed:  when to take this reasons to take this Another medication with the same name was removed. Continue taking this medication, and follow the directions you see here.   Lancet Device Misc 1 each by Does not apply route as directed. Dispense based on patient and insurance preference. Use up to four times daily as directed. (FOR ICD-10 E10.9, E11.9).   metFORMIN  500 MG 24 hr tablet Commonly known as: GLUCOPHAGE -XR Take 1 tablet (500 mg total) by mouth daily with breakfast.        Discharge Exam: Filed Weights   05/10/24 1558  Weight: 75 kg   GEN: NAD SKIN: Warm and dry EYES: No pallor or icterus ENT: MMM, swelling and tenderness on the right jaw have improved.  She is able to open her mouth fully.  No pharyngeal exudates or erythema. CV: RRR PULM: CTA B ABD: soft, ND, NT, +BS CNS: AAO x 3, non focal EXT: No edema or tenderness   Condition at discharge: good  The results of significant diagnostics from this hospitalization (including imaging, microbiology, ancillary and laboratory) are listed below for reference.   Imaging Studies: CT Head Wo Contrast Result Date: 05/11/2024 CLINICAL DATA:  Bloody nose and weakness. EXAM: CT HEAD WITHOUT CONTRAST TECHNIQUE: Contiguous axial images were obtained from the base of the skull through the vertex without intravenous contrast. RADIATION DOSE REDUCTION: This exam was  performed according to the departmental dose-optimization program which includes automated exposure control, adjustment of the mA and/or kV according to patient size and/or use of iterative reconstruction technique. COMPARISON:  None Available. FINDINGS: Brain: No evidence of acute infarction, hemorrhage, hydrocephalus, extra-axial collection or mass lesion/mass effect. Vascular: No hyperdense vessel or unexpected calcification. Skull: Normal. Negative for fracture or focal lesion. Sinuses/Orbits: No acute finding. Other: None. IMPRESSION: No acute intracranial pathology. Electronically Signed   By: Toribio Agreste M.D.   On: 05/11/2024 10:49   CT Soft Tissue Neck W Contrast Result Date: 05/10/2024 CLINICAL DATA:  Initial evaluation for acute infection. EXAM: CT NECK WITH CONTRAST TECHNIQUE: Multidetector CT imaging of the neck was performed using the standard protocol following the bolus administration of intravenous contrast. RADIATION DOSE REDUCTION: This exam was performed according to the departmental dose-optimization program which includes automated exposure control, adjustment of the mA and/or kV according to patient size and/or use of iterative reconstruction technique. CONTRAST:  75mL OMNIPAQUE  IOHEXOL  300 MG/ML  SOLN COMPARISON:  None Available. FINDINGS: Pharynx and larynx: Oral cavity within normal limits. Oropharynx and nasopharynx overall within normal limits. No  retropharyngeal collection or swelling. Negative epiglottis. Hypopharynx and supraglottic larynx within normal limits. Negative eyes. Subglottic airway clear. Salivary glands: Parotid glands and left submandibular gland within normal limits. Asymmetric enlargement with heterogeneity about the right submandibular gland, consistent with acute sialoadenitis. Associated fluid density with inflammatory changes within the adjacent right submandibular and parapharyngeal spaces. No obstructive stone or abscess. Thyroid: Normal. Lymph nodes:  Asymmetric prominence of right upper cervical lymph nodes, presumably reactive. Most prominent of the seen at right level 1 B and measures 9 mm in short axis. Vascular: Normal intravascular enhancement seen within the neck. Limited intracranial: Unremarkable. Visualized orbits: Unremarkable. Mastoids and visualized paranasal sinuses: Clear/normal. Skeleton: No discrete or worrisome osseous lesions. Upper chest: No other acute finding. Other: None. IMPRESSION: 1. Findings consistent with acute right submandibular sialoadenitis. No obstructive stone or abscess. 2. Asymmetric prominence of right upper cervical lymph nodes, presumably reactive. Electronically Signed   By: Morene Hoard M.D.   On: 05/10/2024 21:39    Microbiology: Results for orders placed or performed during the hospital encounter of 05/10/24  Group A Strep by PCR ([facility] Only)     Status: None   Collection Time: 05/10/24  4:00 PM   Specimen: Throat; Sterile Swab  Result Value Ref Range Status   Group A Strep by PCR NOT DETECTED NOT DETECTED Final    Comment: Performed at [facility], [address], [place], [place] [address]  Resp panel by RT-PCR (RSV, Flu A&B, Covid) Throat     Status: None   Collection Time: 05/10/24  4:00 PM   Specimen: Throat; Nasal Swab  Result Value Ref Range Status   SARS Coronavirus 2 by RT PCR NEGATIVE NEGATIVE Final    Comment: (NOTE) SARS-CoV-2 target nucleic acids are NOT DETECTED.  The SARS-CoV-2 RNA is generally detectable in upper respiratory specimens during the acute phase of infection. The lowest concentration of SARS-CoV-2 viral copies this assay can detect is 138 copies/mL. A negative result does not preclude SARS-Cov-2 infection and should not be used as the sole basis for treatment or other patient management decisions. A negative result may occur with  improper specimen collection/handling, submission of specimen other than nasopharyngeal swab, presence of viral  mutation(s) within the areas targeted by this assay, and inadequate number of viral copies(<138 copies/mL). A negative result must be combined with clinical observations, patient history, and epidemiological information. The expected result is Negative.  Fact Sheet for Patients:  bloggercourse.com  Fact Sheet for Healthcare Providers:  seriousbroker.it  This test is no t yet approved or cleared by the [place]  FDA and  has been authorized for detection and/or diagnosis of SARS-CoV-2 by FDA under an Emergency Use Authorization (EUA). This EUA will remain  in effect (meaning this test can be used) for the duration of the COVID-19 declaration under Section 564(b)(1) of the Act, 21 U.S.C.section 360bbb-3(b)(1), unless the authorization is terminated  or revoked sooner.       Influenza A by PCR NEGATIVE NEGATIVE Final   Influenza B by PCR NEGATIVE NEGATIVE Final    Comment: (NOTE) The Xpert Xpress SARS-CoV-2/FLU/RSV plus assay is intended as an aid in the diagnosis of influenza from Nasopharyngeal swab specimens and should not be used as a sole basis for treatment. Nasal washings and aspirates are unacceptable for Xpert Xpress SARS-CoV-2/FLU/RSV testing.  Fact Sheet for Patients: bloggercourse.com  Fact Sheet for Healthcare Providers: seriousbroker.it  This test is not yet approved or cleared by the [place]  FDA and has been  authorized for detection and/or diagnosis of SARS-CoV-2 by FDA under an Emergency Use Authorization (EUA). This EUA will remain in effect (meaning this test can be used) for the duration of the COVID-19 declaration under Section 564(b)(1) of the Act, 21 U.S.C. section 360bbb-3(b)(1), unless the authorization is terminated or revoked.     Resp Syncytial Virus by PCR NEGATIVE NEGATIVE Final    Comment: (NOTE) Fact Sheet for  Patients: bloggercourse.com  Fact Sheet for Healthcare Providers: seriousbroker.it  This test is not yet approved or cleared by the [place]  FDA and has been authorized for detection and/or diagnosis of SARS-CoV-2 by FDA under an Emergency Use Authorization (EUA). This EUA will remain in effect (meaning this test can be used) for the duration of the COVID-19 declaration under Section 564(b)(1) of the Act, 21 U.S.C. section 360bbb-3(b)(1), unless the authorization is terminated or revoked.  Performed at [facility], [address] [place], [place] [address]   Culture, blood (Routine X 2) w Reflex to ID Panel     Status: None (Preliminary result)   Collection Time: 05/10/24 11:11 PM   Specimen: BLOOD  Result Value Ref Range Status   Specimen Description BLOOD LEFT ANTECUBITAL  Final   Special Requests   Final    BOTTLES DRAWN AEROBIC AND ANAEROBIC Blood Culture adequate volume   Culture   Final    NO GROWTH 2 DAYS Performed at [facility], [address] [place], [place] [address]    Report Status PENDING  Incomplete  Culture, blood (Routine X 2) w Reflex to ID Panel     Status: None (Preliminary result)   Collection Time: 05/11/24  3:51 PM   Specimen: BLOOD  Result Value Ref Range Status   Specimen Description BLOOD BLOOD LEFT HAND  Final   Special Requests   Final    BOTTLES DRAWN AEROBIC AND ANAEROBIC Blood Culture adequate volume   Culture   Final    NO GROWTH < 24 HOURS Performed at [facility], [address] [place], [place] [address]    Report Status PENDING  Incomplete    Labs: CBC: Recent Labs  Lab 05/10/24 1736 05/11/24 0520  WBC 5.0 7.1  HGB 10.3* 11.2*  HCT 33.6* 35.9*  MCV 79.1* 77.9*  PLT 267 298   Basic Metabolic Panel: Recent Labs  Lab 05/10/24 1736 05/11/24 0520  NA 137 135  K 3.8 3.9  CL 103 103  CO2 23 23  GLUCOSE 113* 228*  BUN 10 8  CREATININE  0.61 0.67  CALCIUM 8.8* 9.0   Liver Function Tests: Recent Labs  Lab 05/10/24 1736  AST 16  ALT 12  ALKPHOS 100  BILITOT 0.3  PROT 7.2  ALBUMIN 4.1   CBG: Recent Labs  Lab 05/11/24 0836 05/11/24 1254 05/11/24 1440 05/12/24 0804 05/12/24 1222  GLUCAP 202* 220* 178* 173* 178*    Discharge time spent: greater than 30 minutes.  Signed: AIDA CHO, MD [facility] 05/12/2024 "

## 2024-05-12 NOTE — TOC CM/SW Note (Signed)
 Transition of Care [facility]) - Inpatient Brief Assessment   Patient Details  Name: Jaedyn Sifa-Mvumanyi MRN: [id number] Date of Birth: Nov 23, 1985  Transition of Care [facility]) CM/SW Contact:    Grayce JAYSON Perfect, RN Phone Number: 05/12/2024, 1:43 PM   Clinical Narrative:  Transition of Care (TOC) - Inpatient Brief Assessment Transition of Care Department [facility]) has reviewed patient and no TOC needs have been identified at this time.  If new patient transition needs arise, please place a TOC consult.   Patient Details  Name: Kimblery Sifa-Mvumanyi MRN: [id number] Date of Birth: 19-Oct-1985  Transition of Care [facility]) CM/SW Contact:    Grayce JAYSON Perfect, RN Phone Number: 05/12/2024, 1:44 PM   Clinical Narrative     Transition of Care Asessment: Insurance and Status: Insurance coverage has been reviewed Patient has primary care physician: Yes Home environment has been reviewed: lives in home with husband and young children Prior level of function:: independent Prior/Current Home Services: No current home services Social Drivers of Health Review: SDOH reviewed no interventions necessary Readmission risk has been reviewed: Yes Transition of care needs: no transition of care needs at this time

## 2024-05-13 ENCOUNTER — Other Ambulatory Visit: Payer: Self-pay

## 2024-05-13 ENCOUNTER — Emergency Department
Admission: EM | Admit: 2024-05-13 | Discharge: 2024-05-13 | Disposition: A | Discharge status: Home / Self Care | Attending: Emergency Medicine | Admitting: Emergency Medicine

## 2024-05-13 DIAGNOSIS — R739 Hyperglycemia, unspecified: Secondary | ICD-10-CM | POA: Insufficient documentation

## 2024-05-13 LAB — URINALYSIS, ROUTINE W REFLEX MICROSCOPIC
Bilirubin Urine: NEGATIVE
Glucose, UA: NEGATIVE mg/dL
Hgb urine dipstick: NEGATIVE
Ketones, ur: NEGATIVE mg/dL
Leukocytes,Ua: NEGATIVE
Nitrite: NEGATIVE
Protein, ur: NEGATIVE mg/dL
Specific Gravity, Urine: 1.006 (ref 1.005–1.030)
pH: 6 (ref 5.0–8.0)

## 2024-05-13 LAB — CBC
HCT: 35.9 % — ABNORMAL LOW (ref 36.0–46.0)
Hemoglobin: 11.3 g/dL — ABNORMAL LOW (ref 12.0–15.0)
MCH: 24.5 pg — ABNORMAL LOW (ref 26.0–34.0)
MCHC: 31.5 g/dL (ref 30.0–36.0)
MCV: 77.9 fL — ABNORMAL LOW (ref 80.0–100.0)
Platelets: 331 K/uL (ref 150–400)
RBC: 4.61 MIL/uL (ref 3.87–5.11)
RDW: 16.6 % — ABNORMAL HIGH (ref 11.5–15.5)
WBC: 9.8 K/uL (ref 4.0–10.5)
nRBC: 0 % (ref 0.0–0.2)

## 2024-05-13 LAB — BASIC METABOLIC PANEL WITH GFR
Anion gap: 13 (ref 5–15)
BUN: 10 mg/dL (ref 6–20)
CO2: 24 mmol/L (ref 22–32)
Calcium: 8.5 mg/dL — ABNORMAL LOW (ref 8.9–10.3)
Chloride: 101 mmol/L (ref 98–111)
Creatinine, Ser: 0.7 mg/dL (ref 0.44–1.00)
GFR, Estimated: >60 mL/min
Glucose, Bld: 177 mg/dL — ABNORMAL HIGH (ref 70–99)
Potassium: 3.6 mmol/L (ref 3.5–5.1)
Sodium: 138 mmol/L (ref 135–145)

## 2024-05-13 LAB — CBG MONITORING, ED
Glucose-Capillary: 161 mg/dL — ABNORMAL HIGH (ref 70–99)
Glucose-Capillary: 157 mg/dL — ABNORMAL HIGH (ref 70–99)

## 2024-05-13 LAB — POC URINE PREG, ED: Preg Test, Ur: NEGATIVE

## 2024-05-13 NOTE — ED Triage Notes (Addendum)
 Pt presents for hyperglycemia (max 300). +Altered mental status. Endorsing headache and lethargy. Took all medications as prescribed.   Past Medical History:  Diagnosis Date   Mooren's corneal ulcer, right eye    Per IOM Documents   Obesity (BMI 30-39.9)    Uterine fibroid 3.4x3.3x3.2 cm on 03/18/22 u/s 03/24/2022

## 2024-05-13 NOTE — ED Provider Notes (Signed)
 "  [facility] Provider Note    Event Date/Time   First MD Initiated Contact with Patient 05/13/24 2305     (approximate)   History   Hyperglycemia   HPI Phone interpreter utilized Lori Richards is a 39 y.o. female  who presents to the emergency department today because of concern for blood sugar elevation and variation. The patient was discharged from the hospital yesterday after an admission for sialoadenitis.  While admitted the patient was found to be diabetic.  She was started on metformin .  Today she noted that her blood sugar spiked up to the 300s.  She is did state that it was going up and down.  She did have some headache.  At the time of exam she is feeling better. Denies any fevers or chills since discharge. States that the swelling to her face has improved.       Physical Exam   Triage Vital Signs: ED Triage Vitals  Encounter Vitals Group     BP 05/13/24 1948 (!) 160/107     Girls Systolic BP Percentile --      Girls Diastolic BP Percentile --      Boys Systolic BP Percentile --      Boys Diastolic BP Percentile --      Pulse Rate 05/13/24 1948 73     Resp 05/13/24 1948 18     Temp 05/13/24 1948 98.8 F (37.1 C)     Temp Source 05/13/24 1948 Oral     SpO2 05/13/24 1948 99 %     Weight 05/13/24 1949 165 lb 5.5 oz (75 kg)     Height 05/13/24 1949 5' 2 (1.575 m)     Head Circumference --      Peak Flow --      Pain Score 05/13/24 1949 7     Pain Loc --      Pain Education --      Exclude from Growth Chart --     Most recent vital signs: Vitals:   05/13/24 1948  BP: (!) 160/107  Pulse: 73  Resp: 18  Temp: 98.8 F (37.1 C)  SpO2: 99%   General: Awake, alert, oriented. CV:  Good peripheral perfusion. Regular rate and rhythm. Resp:  Normal effort. Lungs clear. Abd:  No distention.    ED Results / Procedures / Treatments   Labs (all labs ordered are listed, but only abnormal results are displayed) Labs Reviewed   BASIC METABOLIC PANEL WITH GFR - Abnormal; Notable for the following components:      Result Value   Glucose, Bld 177 (*)    Calcium 8.5 (*)    All other components within normal limits  CBC - Abnormal; Notable for the following components:   Hemoglobin 11.3 (*)    HCT 35.9 (*)    MCV 77.9 (*)    MCH 24.5 (*)    RDW 16.6 (*)    All other components within normal limits  URINALYSIS, ROUTINE W REFLEX MICROSCOPIC - Abnormal; Notable for the following components:   Color, Urine STRAW (*)    APPearance HAZY (*)    All other components within normal limits  CBG MONITORING, ED - Abnormal; Notable for the following components:   Glucose-Capillary 161 (*)    All other components within normal limits  CBG MONITORING, ED - Abnormal; Notable for the following components:   Glucose-Capillary 157 (*)    All other components within normal limits  CBG MONITORING, ED  POC  URINE PREG, ED     EKG  None   RADIOLOGY None  PROCEDURES:  Critical Care performed: No   MEDICATIONS ORDERED IN ED: Medications - No data to display   IMPRESSION / MDM / ASSESSMENT AND PLAN / ED COURSE  I reviewed the triage vital signs and the nursing notes.                              Differential diagnosis includes, but is not limited to, hyperglycemia, infection, DKA  Patient's presentation is most consistent with acute presentation with potential threat to life or bodily function.  Patient presented to the emergency department today because of concerns for elevated blood sugar.  Patient recently discharged from the hospital with new diagnosis of diabetes.  On exam here patient no acute distress.  Blood work does show slightly elevated blood sugar however no findings concerning for DKA or other significant abnormalities.  I did discuss possibility of some labile blood sugar readings as she gets established on medication and finishes antibiotics for infection.  At this time I think it is reasonable for  patient be discharged.  FINAL CLINICAL IMPRESSION(S) / ED DIAGNOSES   Final diagnoses:  Hyperglycemia      Note:  This document was prepared using Dragon voice recognition software and may include unintentional dictation errors.    [name], MD 05/14/24 [phone]  "

## 2024-05-15 LAB — CULTURE, BLOOD (ROUTINE X 2)
Culture: NO GROWTH
Special Requests: ADEQUATE

## 2024-05-16 LAB — CULTURE, BLOOD (ROUTINE X 2)
Culture: NO GROWTH
Special Requests: ADEQUATE

## 2024-07-28 ENCOUNTER — Ambulatory Visit: Admitting: Family Medicine
